# Patient Record
Sex: Female | Born: 1990
Health system: Southern US, Community
[De-identification: ages and names within clinical notes are randomized; demographics above are authoritative.]

## PROBLEM LIST (undated history)

## (undated) DIAGNOSIS — R112 Nausea with vomiting, unspecified: Secondary | ICD-10-CM

## (undated) DIAGNOSIS — F410 Panic disorder [episodic paroxysmal anxiety] without agoraphobia: Secondary | ICD-10-CM

## (undated) DIAGNOSIS — F419 Anxiety disorder, unspecified: Secondary | ICD-10-CM

## (undated) DIAGNOSIS — F99 Mental disorder, not otherwise specified: Secondary | ICD-10-CM

## (undated) DIAGNOSIS — N289 Disorder of kidney and ureter, unspecified: Secondary | ICD-10-CM

## (undated) DIAGNOSIS — Z9889 Other specified postprocedural states: Secondary | ICD-10-CM

## (undated) HISTORY — PX: CHOLECYSTECTOMY: SHX55

## (undated) HISTORY — PX: MYRINGOTOMY: SUR874

## (undated) HISTORY — PX: SHOULDER SURGERY: SHX246

## (undated) HISTORY — DX: Mental disorder, not otherwise specified: F99

---

## 2009-03-12 ENCOUNTER — Ambulatory Visit: Payer: Self-pay | Admitting: Family Medicine

## 2009-08-07 ENCOUNTER — Emergency Department: Payer: Self-pay | Admitting: Emergency Medicine

## 2009-09-06 ENCOUNTER — Ambulatory Visit: Payer: Self-pay | Admitting: General Surgery

## 2009-09-13 ENCOUNTER — Ambulatory Visit: Payer: Self-pay | Admitting: General Surgery

## 2010-01-23 DIAGNOSIS — Z62810 Personal history of physical and sexual abuse in childhood: Secondary | ICD-10-CM | POA: Insufficient documentation

## 2010-08-24 ENCOUNTER — Emergency Department: Payer: Self-pay | Admitting: Emergency Medicine

## 2010-12-10 ENCOUNTER — Emergency Department: Payer: Self-pay | Admitting: Emergency Medicine

## 2010-12-11 ENCOUNTER — Emergency Department: Payer: Self-pay | Admitting: Emergency Medicine

## 2011-03-03 ENCOUNTER — Emergency Department: Payer: Self-pay | Admitting: Emergency Medicine

## 2012-03-07 ENCOUNTER — Emergency Department: Payer: Self-pay | Admitting: Emergency Medicine

## 2012-03-08 ENCOUNTER — Encounter (HOSPITAL_COMMUNITY): Payer: Self-pay | Admitting: *Deleted

## 2012-03-08 ENCOUNTER — Emergency Department (HOSPITAL_COMMUNITY)
Admission: EM | Admit: 2012-03-08 | Discharge: 2012-03-08 | Disposition: A | Discharge status: Home / Self Care | Payer: Self-pay | Attending: Emergency Medicine | Admitting: Emergency Medicine

## 2012-03-08 DIAGNOSIS — L03119 Cellulitis of unspecified part of limb: Secondary | ICD-10-CM | POA: Insufficient documentation

## 2012-03-08 DIAGNOSIS — L02419 Cutaneous abscess of limb, unspecified: Secondary | ICD-10-CM | POA: Insufficient documentation

## 2012-03-08 DIAGNOSIS — L0390 Cellulitis, unspecified: Secondary | ICD-10-CM

## 2012-03-08 LAB — CBC
HCT: 38.8 % (ref 36.0–46.0)
Hemoglobin: 12.9 g/dL (ref 12.0–15.0)
MCH: 29.1 pg (ref 26.0–34.0)
MCHC: 33.2 g/dL (ref 30.0–36.0)
MCV: 87.6 fL (ref 78.0–100.0)
Platelets: 222 10*3/uL (ref 150–400)
RBC: 4.43 MIL/uL (ref 3.87–5.11)
RDW: 12.7 % (ref 11.5–15.5)
WBC: 8.6 10*3/uL (ref 4.0–10.5)

## 2012-03-08 MED ORDER — DIPHENHYDRAMINE HCL 50 MG/ML IJ SOLN
25.0000 mg | Freq: Once | INTRAMUSCULAR | Status: AC
  Administered 2012-03-08: 25 mg via INTRAVENOUS
  Filled 2012-03-08: qty 1

## 2012-03-08 MED ORDER — GI COCKTAIL ~~LOC~~
30.0000 mL | Freq: Once | ORAL | Status: DC
  Filled 2012-03-08: qty 30

## 2012-03-08 MED ORDER — ONDANSETRON HCL 4 MG/2ML IJ SOLN
4.0000 mg | Freq: Once | INTRAMUSCULAR | Status: AC
  Administered 2012-03-08: 4 mg via INTRAVENOUS
  Filled 2012-03-08: qty 2

## 2012-03-08 MED ORDER — MORPHINE SULFATE 4 MG/ML IJ SOLN
4.0000 mg | Freq: Once | INTRAMUSCULAR | Status: AC
  Administered 2012-03-08: 4 mg via INTRAVENOUS
  Filled 2012-03-08: qty 1

## 2012-03-08 MED ORDER — VANCOMYCIN HCL IN DEXTROSE 1-5 GM/200ML-% IV SOLN
1000.0000 mg | Freq: Once | INTRAVENOUS | Status: AC
  Administered 2012-03-08: 1000 mg via INTRAVENOUS
  Filled 2012-03-08: qty 200

## 2012-03-08 NOTE — ED Notes (Signed)
Went to [facility] and was bactrim. Now is getting bigger

## 2012-03-08 NOTE — ED Notes (Signed)
Discharge instructions reviewed with pt, questions answered. Pt verbalized understanding.  

## 2012-03-08 NOTE — ED Notes (Signed)
Family at bedside. Patient states she is feeling a lot better.

## 2012-03-08 NOTE — ED Notes (Signed)
Pt stated feeling much better since benadryl given

## 2012-03-08 NOTE — ED Provider Notes (Signed)
History     CSN: [id number]  Arrival date & time 03/08/12  6578   First MD Initiated Contact with Patient 03/08/12 0539      Chief Complaint  Patient presents with  . Recurrent Skin Infections    (Consider location/radiation/quality/duration/timing/severity/associated sxs/prior treatment) The history is provided by the patient.  location left thigh. Pain sharp in quality and not radiating. There is associated rash and swelling. About 24 hours ago patient was going to bed and felt something bite her on her left thigh. She did not see an insect or spider and denies seeing a stinger. She developed rash and swelling and went to the [facility] her she was prescribed tramadol and Bactrim. She has Motrin 800 mg at home that she's also been taking. She presents here for redness swelling extending beyond inked outline done at [facility]. She denies any fevers or chills. No nausea or vomiting. No weakness or numbness. No pointing or drainage from wound. Patient very concerned that she did have blood work done and she was not given IV antibiotics. No history of MRSA or abscess.  History reviewed. No pertinent past medical history.  Past Surgical History  Procedure Date  . Cholecystectomy     History reviewed. No pertinent family history.  History  Substance Use Topics  . Smoking status: Not on file  . Smokeless tobacco: Not on file  . Alcohol Use: Yes    OB History    Grav Para Term Preterm Abortions TAB SAB Ect Mult Living                  Review of Systems  Constitutional: Negative for fever and chills.  HENT: Negative for neck pain and neck stiffness.   Eyes: Negative for pain.  Respiratory: Negative for shortness of breath.   Cardiovascular: Negative for chest pain.  Gastrointestinal: Negative for abdominal pain.  Genitourinary: Negative for dysuria.  Musculoskeletal: Negative for back pain.  Skin: Positive for rash.  Neurological: Negative for headaches.  All other  systems reviewed and are negative.    Allergies  Zithromax  Home Medications   Current Outpatient Rx  Name Route Sig Dispense Refill  . NORETHIN-ETH ESTRAD TRIPHASIC 0.5/0.75/1-35 MG-MCG PO TABS Oral Take 1 tablet by mouth daily.    . SULFAMETHOXAZOLE-TMP DS 800-160 MG PO TABS Oral Take 1 tablet by mouth 2 (two) times daily.      BP 114/68  Pulse 83  Temp(Src) 98.2 F (36.8 C) (Oral)  Resp 12  Ht 5\' 3"  (1.6 m)  Wt 152 lb (68.947 kg)  BMI 26.93 kg/m2  SpO2 100%  Physical Exam  Constitutional: She is oriented to person, place, and time. She appears well-developed and well-nourished.  HENT:  Head: Normocephalic and atraumatic.  Eyes: Conjunctivae and EOM are normal. Pupils are equal, round, and reactive to light.  Neck: Trachea normal. Neck supple. No thyromegaly present.  Cardiovascular: Normal rate, regular rhythm, S1 normal, S2 normal and normal pulses.     No systolic murmur is present   No diastolic murmur is present  Pulses:      Radial pulses are 2+ on the right side, and 2+ on the left side.  Pulmonary/Chest: Effort normal and breath sounds normal. She has no wheezes. She has no rhonchi. She has no rales. She exhibits no tenderness.  Abdominal: Soft. Normal appearance and bowel sounds are normal. There is no tenderness. There is no CVA tenderness and negative Murphy's sign.  Musculoskeletal:  Left lower extremity: Area of tenderness and mild swelling with erythema and warmth to enter proximal left thigh. There is erythema just beyond the borders of an ink outline. There is no streaking lymphangitis. Distal neurovascular is intact. No cords and negative Homans sign. No calf tenderness or swelling. No evidence of retained insect parts or stinger. No pointing or fluctuance.  Neurological: She is alert and oriented to person, place, and time. She has normal strength. No cranial nerve deficit or sensory deficit. GCS eye subscore is 4. GCS verbal subscore is 5. GCS motor  subscore is 6.  Skin: Skin is warm and dry. No rash noted. She is not diaphoretic.  Psychiatric: Her speech is normal.       Cooperative and appropriate    ED Course  Procedures (including critical care time)   Labs Reviewed  CBC    IV antibiotics and ice. Pain medications provided.  MDM   Presentation consistent with localized reaction to insect envenomation. No retained insect parts on exam. No clinical abscess.  Patient is prescribed appropriate antibiotics in addition to pain medications. She has no fevers or leukocytosis.  Plan recheck 48 hours continue outpatient management. Reliable historian and verbalizes understanding all discharge and followup instructions.       Sunnie Nielsen, MD 03/08/12 [phone]

## 2012-03-08 NOTE — ED Notes (Signed)
Pt co itching all over, MD aware

## 2012-03-08 NOTE — Discharge Instructions (Signed)
Cellulitis Cellulitis is an infection of the tissue under the skin. The infected area is usually red and tender. This is caused by germs. These germs enter the body through cuts or sores. This usually happens in the arms or lower legs.  Continue antibiotics as prescribed. Apply ice as discussed. Take pain medications as prescribed and Motrin as needed for pain and swelling. Return here in 2 days for recheck. Be evaluated sooner for any worsening condition HOME CARE   Take your medicine as told. Finish it even if you start to feel better.   If the infection is on the arm or leg, keep it raised (elevated).   Use a warm cloth on the infected area several times a day.   See your doctor for a follow-up visit as told.  GET HELP RIGHT AWAY IF:   You are tired or confused.   You throw up (vomit).   You have watery poop (diarrhea).   You feel ill and have muscle aches.   You have a fever.  MAKE SURE YOU:   Understand these instructions.   Will watch your condition.   Will get help right away if you are not doing well or get worse.

## 2012-03-08 NOTE — ED Notes (Signed)
Pt co "heartburn", MD to be notified.

## 2012-09-01 ENCOUNTER — Encounter (HOSPITAL_COMMUNITY): Payer: Self-pay | Admitting: *Deleted

## 2012-09-01 ENCOUNTER — Emergency Department (HOSPITAL_COMMUNITY)
Admission: EM | Admit: 2012-09-01 | Discharge: 2012-09-02 | Disposition: A | Discharge status: Home / Self Care | Payer: BC Managed Care – PPO | Attending: Emergency Medicine | Admitting: Emergency Medicine

## 2012-09-01 ENCOUNTER — Emergency Department (HOSPITAL_COMMUNITY): Payer: BC Managed Care – PPO

## 2012-09-01 DIAGNOSIS — R110 Nausea: Secondary | ICD-10-CM | POA: Insufficient documentation

## 2012-09-01 DIAGNOSIS — Z9089 Acquired absence of other organs: Secondary | ICD-10-CM | POA: Insufficient documentation

## 2012-09-01 DIAGNOSIS — R109 Unspecified abdominal pain: Secondary | ICD-10-CM | POA: Insufficient documentation

## 2012-09-01 LAB — BASIC METABOLIC PANEL
BUN: 10 mg/dL (ref 6–23)
CO2: 25 mEq/L (ref 19–32)
Calcium: 9.7 mg/dL (ref 8.4–10.5)
Chloride: 102 mEq/L (ref 96–112)
Creatinine, Ser: 0.69 mg/dL (ref 0.50–1.10)
GFR calc Af Amer: >90 mL/min (ref >90)
GFR calc non Af Amer: >90 mL/min (ref >90)
Glucose, Bld: 95 mg/dL (ref 70–99)
Potassium: 3.5 mEq/L (ref 3.5–5.1)
Sodium: 137 mEq/L (ref 135–145)

## 2012-09-01 LAB — URINALYSIS, ROUTINE W REFLEX MICROSCOPIC
Bilirubin Urine: NEGATIVE
Glucose, UA: NEGATIVE mg/dL
Hgb urine dipstick: NEGATIVE
Ketones, ur: NEGATIVE mg/dL
Leukocytes, UA: NEGATIVE
Nitrite: NEGATIVE
Protein, ur: NEGATIVE mg/dL
Specific Gravity, Urine: 1.02 (ref 1.005–1.030)
Urobilinogen, UA: 0.2 mg/dL (ref 0.0–1.0)
pH: 6.5 (ref 5.0–8.0)

## 2012-09-01 LAB — CBC WITH DIFFERENTIAL/PLATELET
Basophils Absolute: 0 10*3/uL (ref 0.0–0.1)
Basophils Relative: 0 % (ref 0–1)
Eosinophils Absolute: 0.1 10*3/uL (ref 0.0–0.7)
Eosinophils Relative: 1 % (ref 0–5)
HCT: 40 % (ref 36.0–46.0)
Hemoglobin: 13.8 g/dL (ref 12.0–15.0)
Lymphocytes Relative: 27 % (ref 12–46)
Lymphs Abs: 2.7 10*3/uL (ref 0.7–4.0)
MCH: 30.3 pg (ref 26.0–34.0)
MCHC: 34.5 g/dL (ref 30.0–36.0)
MCV: 87.9 fL (ref 78.0–100.0)
Monocytes Absolute: 0.6 10*3/uL (ref 0.1–1.0)
Monocytes Relative: 6 % (ref 3–12)
Neutro Abs: 6.6 10*3/uL (ref 1.7–7.7)
Neutrophils Relative %: 66 % (ref 43–77)
Platelets: 237 10*3/uL (ref 150–400)
RBC: 4.55 MIL/uL (ref 3.87–5.11)
RDW: 12.3 % (ref 11.5–15.5)
WBC: 10 10*3/uL (ref 4.0–10.5)

## 2012-09-01 LAB — HCG, QUANTITATIVE, PREGNANCY: hCG, Beta Chain, Quant, S: 1 m[IU]/mL (ref <5)

## 2012-09-01 LAB — POCT PREGNANCY, URINE: Preg Test, Ur: NEGATIVE

## 2012-09-01 MED ORDER — SODIUM CHLORIDE 0.9 % IV SOLN: INTRAVENOUS | Status: DC

## 2012-09-01 MED ORDER — ONDANSETRON HCL 4 MG/2ML IJ SOLN
4.0000 mg | Freq: Once | INTRAMUSCULAR | Status: AC
  Administered 2012-09-01: 4 mg via INTRAVENOUS
  Filled 2012-09-01: qty 2

## 2012-09-01 MED ORDER — SODIUM CHLORIDE 0.9 % IV BOLUS (SEPSIS)
250.0000 mL | Freq: Once | INTRAVENOUS | Status: AC
  Administered 2012-09-01: 23:00:00 via INTRAVENOUS

## 2012-09-01 NOTE — ED Provider Notes (Addendum)
History  This chart was scribed for Shelda Jakes, MD by Bennett Scrape. This patient was seen in room APA09/APA09 and the patient's care was started at 7:56PM.  CSN: [id number]  Arrival date & time 09/01/12  1632   First MD Initiated Contact with Patient 09/01/12 1956      Chief Complaint  Patient presents with  . Abdominal Pain     Patient is a 21 y.o. female presenting with abdominal pain. The history is provided by the patient. No language interpreter was used.  Abdominal Pain The primary symptoms of the illness include abdominal pain and nausea. The primary symptoms of the illness do not include fever, shortness of breath, vomiting or diarrhea. The current episode started more than 2 days ago. The problem has not changed since onset. Symptoms associated with the illness do not include chills or back pain. Significant associated medical issues do not include inflammatory bowel disease or diabetes.    Donna Wallace is a 21 y.o. female who presents to the Emergency Department complaining of one week of intermittent middle right abdominal pain described as stabbing that is worse with urination with associated nausea. She denies having pain currently. She reports that the episodes last between 30 minutes to one hour.  She reports a possible pregnancy with associated symptoms of vaginal discharge, breast swelling and tenderness and nasuea but states that she has taken several negative pregnancy tests at home. She reports that her LNMP was 07/20/2012 and she denies any vaginal bleeding since. She reports having a h/o prior miscarriages and denies any full term pregnancies. She denies fever, neck pain, sore throat, visual disturbance, CP, cough, SOB, emesis, diarrhea, urinary symptoms, back pain, HA, weakness, numbness and rash as associated symptoms. She does not have a h/o chronic medical conditions. She is an occasional alcohol user.  No current PCP.  History reviewed. No pertinent  past medical history.  Past Surgical History  Procedure Date  . Cholecystectomy     No family history on file.  History  Substance Use Topics  . Smoking status: Not on file  . Smokeless tobacco: Not on file  . Alcohol Use: Yes    No OB history provided.  Review of Systems  Constitutional: Negative for fever and chills.  HENT: Negative for congestion and sore throat.   Eyes: Negative for visual disturbance.  Respiratory: Negative for cough and shortness of breath.   Cardiovascular: Negative for chest pain.  Gastrointestinal: Positive for nausea and abdominal pain. Negative for vomiting and diarrhea.  Musculoskeletal: Negative for back pain.  Skin: Negative for rash.    Allergies  Dilaudid and Zithromax  Home Medications   Current Outpatient Rx  Name Route Sig Dispense Refill  . PRENATAL MULTIVITAMIN CH Oral Take 1 tablet by mouth daily.    Marland Kitchen HYDROCODONE-ACETAMINOPHEN 5-325 MG PO TABS Oral Take 1-2 tablets by mouth every 6 (six) hours as needed for pain. 14 tablet 0  . PROMETHAZINE HCL 25 MG PO TABS Oral Take 1 tablet (25 mg total) by mouth every 6 (six) hours as needed for nausea. 12 tablet 0    Triage Vitals: BP 125/77  Pulse 77  Temp 98.5 F (36.9 C) (Oral)  Resp 20  Ht 5\' 3"  (1.6 m)  Wt 154 lb (69.854 kg)  BMI 27.28 kg/m2  SpO2 100%  LMP 07/20/2012  Physical Exam  Nursing note and vitals reviewed. Constitutional: She is oriented to person, place, and time. She appears well-developed and well-nourished. No distress.  HENT:  Head: Normocephalic and atraumatic.  Eyes: Conjunctivae normal and EOM are normal.  Neck: Neck supple. No tracheal deviation present.  Cardiovascular: Normal rate and regular rhythm.   No murmur heard. Pulmonary/Chest: Effort normal and breath sounds normal. No respiratory distress.  Abdominal: Soft. Bowel sounds are normal. There is no tenderness.  Musculoskeletal: Normal range of motion. She exhibits no edema (no ankle swelling).         Able to move both sets of toes and fingers  Neurological: She is alert and oriented to person, place, and time. No cranial nerve deficit.  Skin: Skin is warm and dry.  Psychiatric: She has a normal mood and affect. Her behavior is normal.    ED Course  Procedures (including critical care time)  DIAGNOSTIC STUDIES: Oxygen Saturation is 100% on room air, normal by my interpretation.    COORDINATION OF CARE: 8:25PM-Informed pt of negative pregnancy test. Pt reports that she wants a blood test to confirm, because her menstrual cycle 3 weeks late.  8:28PM-Discussed treatment plan which includes CT of abdomen if pregnancy test is negative or [place] if pregnancy test is positive with pt at bedside and pt agreed to plan.   Labs Reviewed  URINALYSIS, ROUTINE W REFLEX MICROSCOPIC  POCT PREGNANCY, URINE  CBC WITH DIFFERENTIAL  BASIC METABOLIC PANEL  HCG, QUANTITATIVE, PREGNANCY   Ct Abdomen Pelvis W Contrast  09/02/2012  *RADIOLOGY REPORT*  Clinical Data: Abdominal pain  CT ABDOMEN AND PELVIS WITH CONTRAST  Technique:  Multidetector CT imaging of the abdomen and pelvis was performed following the standard protocol during bolus administration of intravenous contrast.  Contrast: OMNIPAQUE IOHEXOL 300 MG/ML  SOLN  Comparison: None.  Findings: Limited images through the lung bases demonstrate no significant appreciable abnormality. The heart size is within normal limits. No pleural or pericardial effusion.  Unremarkable liver, spleen, pancreas, adrenal glands, left kidney. Absent gallbladder.  No biliary duct dilatation.  There is mild pelvicaliectasis on the right with smooth tapering to a normal caliber ureter.  No ureteral calculi identified.  No bowel obstruction.  No CT evidence for colitis.  Appendix within normal limits.  No free intraperitoneal air.  No lymphadenopathy.  Normal caliber vasculature.  Circumaortic left renal vein.  Thin-walled bladder.  Unremarkable CT appearance to the  uterus and ovaries.  Trace free fluid within the right adnexa, often physiologic.  No acute osseous finding.  IMPRESSION: Minimal right pelvicaliectasis without hydroureter.  Favored to be incidental.  Otherwise, no acute abnormality identified.  Normal appendix.   Original Report Authenticated By: Waneta Martins, M.D.     Results for orders placed during the hospital encounter of 09/01/12  URINALYSIS, ROUTINE W REFLEX MICROSCOPIC      Component Value Range   Color, Urine YELLOW  YELLOW   APPearance CLEAR  CLEAR   Specific Gravity, Urine 1.020  1.005 - 1.030   pH 6.5  5.0 - 8.0   Glucose, UA NEGATIVE  NEGATIVE mg/dL   Hgb urine dipstick NEGATIVE  NEGATIVE   Bilirubin Urine NEGATIVE  NEGATIVE   Ketones, ur NEGATIVE  NEGATIVE mg/dL   Protein, ur NEGATIVE  NEGATIVE mg/dL   Urobilinogen, UA 0.2  0.0 - 1.0 mg/dL   Nitrite NEGATIVE  NEGATIVE   Leukocytes, UA NEGATIVE  NEGATIVE  POCT PREGNANCY, URINE      Component Value Range   Preg Test, Ur NEGATIVE  NEGATIVE  CBC WITH DIFFERENTIAL      Component Value Range   WBC 10.0  4.0 - 10.5 K/uL   RBC 4.55  3.87 - 5.11 MIL/uL   Hemoglobin 13.8  12.0 - 15.0 g/dL   HCT 13.0  86.5 - 78.4 %   MCV 87.9  78.0 - 100.0 fL   MCH 30.3  26.0 - 34.0 pg   MCHC 34.5  30.0 - 36.0 g/dL   RDW 69.6  29.5 - 28.4 %   Platelets 237  150 - 400 K/uL   Neutrophils Relative 66  43 - 77 %   Neutro Abs 6.6  1.7 - 7.7 K/uL   Lymphocytes Relative 27  12 - 46 %   Lymphs Abs 2.7  0.7 - 4.0 K/uL   Monocytes Relative 6  3 - 12 %   Monocytes Absolute 0.6  0.1 - 1.0 K/uL   Eosinophils Relative 1  0 - 5 %   Eosinophils Absolute 0.1  0.0 - 0.7 K/uL   Basophils Relative 0  0 - 1 %   Basophils Absolute 0.0  0.0 - 0.1 K/uL  BASIC METABOLIC PANEL      Component Value Range   Sodium 137  135 - 145 mEq/L   Potassium 3.5  3.5 - 5.1 mEq/L   Chloride 102  96 - 112 mEq/L   CO2 25  19 - 32 mEq/L   Glucose, Bld 95  70 - 99 mg/dL   BUN 10  6 - 23 mg/dL   Creatinine, Ser 1.32   0.50 - 1.10 mg/dL   Calcium 9.7  8.4 - 44.0 mg/dL   GFR calc non Af Amer >90  >90 mL/min   GFR calc Af Amer >90  >90 mL/min  HCG, QUANTITATIVE, PREGNANCY      Component Value Range   hCG, Beta Chain, Quant, S 1  <5 mIU/mL     1. Abdominal pain       MDM  CT scan of abdomen is pending. Patient was convinced that she was pregnant based on her symptoms however urine pretty test was negative and the hCG beta Quant also was less than 5 which is consistent with a negative pregnancy test.  Disposition will be based on CT scan. Patient also given referral to Dr. Emelda Fear patient's had several miscarriages in the past no evidence of miscarriage her tonight but will have her consult with him.     I personally performed the services described in this documentation, which was scribed in my presence. The recorded information has been reviewed and considered.     Shelda Jakes, MD 09/02/12 0028   Addendum: CT scan without specific findings. We'll discharge patient home with referral to Dr. Emelda Fear pain medicine and antinausea medicine.  Shelda Jakes, MD 09/02/12 [phone]

## 2012-09-01 NOTE — ED Notes (Signed)
Pt states lower abdominal pain which is worse with urination.

## 2012-09-02 MED ORDER — PROMETHAZINE HCL 25 MG PO TABS: 25.0000 mg | ORAL_TABLET | Freq: Four times a day (QID) | ORAL | Status: DC | PRN

## 2012-09-02 MED ORDER — IOHEXOL 300 MG/ML  SOLN
100.0000 mL | Freq: Once | INTRAMUSCULAR | Status: AC | PRN
  Administered 2012-09-02: 100 mL via INTRAVENOUS

## 2012-09-02 MED ORDER — HYDROCODONE-ACETAMINOPHEN 5-325 MG PO TABS: 1.0000 | ORAL_TABLET | Freq: Four times a day (QID) | ORAL | Status: AC | PRN

## 2012-11-20 ENCOUNTER — Emergency Department (HOSPITAL_COMMUNITY)
Admission: EM | Admit: 2012-11-20 | Discharge: 2012-11-20 | Disposition: A | Discharge status: Home / Self Care | Payer: BC Managed Care – PPO | Attending: Emergency Medicine | Admitting: Emergency Medicine

## 2012-11-20 ENCOUNTER — Encounter (HOSPITAL_COMMUNITY): Payer: Self-pay | Admitting: Emergency Medicine

## 2012-11-20 ENCOUNTER — Emergency Department (HOSPITAL_COMMUNITY): Payer: BC Managed Care – PPO

## 2012-11-20 DIAGNOSIS — F419 Anxiety disorder, unspecified: Secondary | ICD-10-CM

## 2012-11-20 DIAGNOSIS — F411 Generalized anxiety disorder: Secondary | ICD-10-CM | POA: Insufficient documentation

## 2012-11-20 DIAGNOSIS — Z87891 Personal history of nicotine dependence: Secondary | ICD-10-CM | POA: Insufficient documentation

## 2012-11-20 DIAGNOSIS — R110 Nausea: Secondary | ICD-10-CM | POA: Insufficient documentation

## 2012-11-20 DIAGNOSIS — Z8659 Personal history of other mental and behavioral disorders: Secondary | ICD-10-CM | POA: Insufficient documentation

## 2012-11-20 DIAGNOSIS — R0602 Shortness of breath: Secondary | ICD-10-CM | POA: Insufficient documentation

## 2012-11-20 DIAGNOSIS — Z87448 Personal history of other diseases of urinary system: Secondary | ICD-10-CM | POA: Insufficient documentation

## 2012-11-20 DIAGNOSIS — R0789 Other chest pain: Secondary | ICD-10-CM | POA: Insufficient documentation

## 2012-11-20 HISTORY — DX: Panic disorder (episodic paroxysmal anxiety): F41.0

## 2012-11-20 HISTORY — DX: Disorder of kidney and ureter, unspecified: N28.9

## 2012-11-20 MED ORDER — LORAZEPAM 1 MG PO TABS
1.0000 mg | ORAL_TABLET | Freq: Once | ORAL | Status: DC
  Filled 2012-11-20: qty 1

## 2012-11-20 MED ORDER — PROMETHAZINE HCL 12.5 MG PO TABS
12.5000 mg | ORAL_TABLET | Freq: Once | ORAL | Status: DC
  Filled 2012-11-20: qty 1

## 2012-11-20 MED ORDER — LORAZEPAM 0.5 MG PO TABS: 0.5000 mg | ORAL_TABLET | Freq: Four times a day (QID) | ORAL | Status: DC | PRN

## 2012-11-20 MED ORDER — PROMETHAZINE HCL 12.5 MG PO TABS: 12.5000 mg | ORAL_TABLET | Freq: Four times a day (QID) | ORAL | Status: DC | PRN

## 2012-11-20 NOTE — ED Notes (Signed)
Hx of panic attacks. States she and husband had argument this am and started having stabbing pain under Left breast that has been constant with nausea. Pt slightly tearful. Denies sob/dizziness/v/d. States this is how she normally feels with her anxiety attacks but the cp is more constant than usual.

## 2012-11-20 NOTE — ED Notes (Signed)
phenergen and ativan order cancelled vo Ivery Quale PA due to pt driving and has no ride.

## 2012-11-20 NOTE — ED Provider Notes (Signed)
History     CSN: [id number]  Arrival date & time 11/20/12  4098   First MD Initiated Contact with Patient 11/20/12 [phone]      Chief Complaint  Patient presents with  . Chest Pain    (Consider location/radiation/quality/duration/timing/severity/associated sxs/prior treatment) Patient is a 21 y.o. female presenting with chest pain. The history is provided by the patient.  Chest Pain The chest pain began 1 - 2 hours ago. Chest pain occurs intermittently. The chest pain is worsening. The pain is associated with stress. At its most intense, the pain is at 9/10. The pain is currently at 4/10. The quality of the pain is described as stabbing. The pain does not radiate. Chest pain is worsened by stress. Primary symptoms include shortness of breath and nausea. Pertinent negatives for primary symptoms include no fatigue, no syncope, no cough, no wheezing, no palpitations, no abdominal pain, no vomiting and no dizziness.  Pertinent negatives for associated symptoms include no claudication, no diaphoresis, no near-syncope, no numbness and no weakness. She tried nothing for the symptoms. Risk factors include alcohol intake.  Her past medical history is significant for anxiety/panic attacks.  Pertinent negatives for past medical history include no arrhythmia, no diabetes, no DVT, no hypertension, no PE, no seizures and no strokes.  Pertinent negatives for family medical history include: no hypertension in family.  Procedure history is negative for cardiac catheterization, echocardiogram, stress echo and exercise treadmill test.     Past Medical History  Diagnosis Date  . Panic attacks   . Renal disorder     Past Surgical History  Procedure Date  . Cholecystectomy   . Myringotomy     History reviewed. No pertinent family history.  History  Substance Use Topics  . Smoking status: Former Games developer  . Smokeless tobacco: Not on file  . Alcohol Use: Yes     Comment: weekends    OB History     Grav Para Term Preterm Abortions TAB SAB Ect Mult Living                  Review of Systems  Constitutional: Negative for diaphoresis, activity change and fatigue.       All ROS Neg except as noted in HPI  HENT: Negative for nosebleeds and neck pain.   Eyes: Negative for photophobia and discharge.  Respiratory: Positive for shortness of breath. Negative for cough and wheezing.   Cardiovascular: Positive for chest pain. Negative for palpitations, claudication, syncope and near-syncope.  Gastrointestinal: Positive for nausea. Negative for vomiting, abdominal pain and blood in stool.  Genitourinary: Negative for dysuria, frequency and hematuria.  Musculoskeletal: Negative for back pain and arthralgias.  Skin: Negative.   Neurological: Negative for dizziness, seizures, speech difficulty, weakness and numbness.  Psychiatric/Behavioral: Negative for hallucinations and confusion.    Allergies  Dilaudid and Zithromax  Home Medications   Current Outpatient Rx  Name  Route  Sig  Dispense  Refill  . PRENATAL MULTIVITAMIN CH   Oral   Take 1 tablet by mouth daily.         Marland Kitchen PROMETHAZINE HCL 25 MG PO TABS   Oral   Take 1 tablet (25 mg total) by mouth every 6 (six) hours as needed for nausea.   12 tablet   0     BP 120/78  Pulse 81  Temp 98.1 F (36.7 C) (Oral)  Resp 17  SpO2 99%  LMP 11/20/2012  Physical Exam  Nursing note and vitals reviewed. Constitutional: She  is oriented to person, place, and time. She appears well-developed and well-nourished.  Non-toxic appearance.  HENT:  Head: Normocephalic.  Right Ear: Tympanic membrane and external ear normal.  Left Ear: Tympanic membrane and external ear normal.  Eyes: EOM and lids are normal. Pupils are equal, round, and reactive to light.  Neck: Normal range of motion. Neck supple. Carotid bruit is not present.  Cardiovascular: Normal rate, regular rhythm, normal heart sounds, intact distal pulses and normal pulses.    Pulmonary/Chest: Breath sounds normal. No respiratory distress.       Mild left chest wall tenderness. No palpable deformity. Symmetrical rise and fall of the chest.  Abdominal: Soft. Bowel sounds are normal. There is no tenderness. There is no guarding.  Musculoskeletal: Normal range of motion.  Lymphadenopathy:       Head (right side): No submandibular adenopathy present.       Head (left side): No submandibular adenopathy present.    She has no cervical adenopathy.  Neurological: She is alert and oriented to person, place, and time. She has normal strength. No cranial nerve deficit or sensory deficit.  Skin: Skin is warm and dry.  Psychiatric: Her speech is normal. Her mood appears anxious.    ED Course  Procedures (including critical care time)  Labs Reviewed - No data to display No results found. EKG 8:35. Rate 79. Rhythm-  normal sinus rhythm. PR-normal. Axis-normal. QRS-normal. ST-normal.No STEMI. No life threatening arrhythmias. No old EKG for comparison.  No diagnosis found.    MDM  I have reviewed nursing notes, vital signs, and all appropriate lab and imaging results for this patient. Patient states that she and her husband were in an argument this early a.m. and she began to have a stabbing pain in the left chest and breast area as the argument escalated. It is of note that the patient has a history of panic type attacks. Patient reports that the symptoms that she had this morning are similar to previous anxiety related attacks. There no abnormal murmurs or rubs or symptoms may cause concern for pericarditis. The electrocardiogram shows a normal sinus rhythm in the pattern of the pain does not seem to be consistent with a acute coronary syndrome. Chest x-ray was normal and shows no evidence of a pneumothorax. There is no recent surgery, no recent trauma, no recent extended trips, patient is on birth control pills and a smoker, but no physical exam findings that raise suspicion  for PE.  Vital signs are stable. Pulse oximetry is 99% on room air. Patient speaks in complete sentences. Feel that it is safe for the patient to be discharged home. Patient is given a prescription for short course of nausea medication, promethazine 12.5 mg one every 6 hours. Patient also given Ativan 0.5 mg one every 6 hours as needed for anxiety #15. Patient strongly encouraged to see the behavioral health specialist in the [place] area.       Kathie Dike, [place] 11/20/12 1004

## 2012-11-20 NOTE — ED Provider Notes (Signed)
Medical screening examination/treatment/procedure(s) were performed by non-physician practitioner and as supervising physician I was immediately available for consultation/collaboration.  Raeford Razor, MD 11/20/12 [phone]

## 2013-02-18 ENCOUNTER — Emergency Department: Payer: Self-pay | Admitting: Emergency Medicine

## 2013-02-28 ENCOUNTER — Emergency Department: Payer: Self-pay | Admitting: Emergency Medicine

## 2013-02-28 LAB — URINALYSIS, COMPLETE
Bilirubin,UR: NEGATIVE
Blood: NEGATIVE
Glucose,UR: NEGATIVE mg/dL (ref 0–75)
Ketone: NEGATIVE
Leukocyte Esterase: NEGATIVE
Nitrite: NEGATIVE
Ph: 7 (ref 4.5–8.0)
Protein: NEGATIVE
RBC,UR: 1 /HPF (ref 0–5)
Specific Gravity: 1.016 (ref 1.003–1.030)
Squamous Epithelial: 1
WBC UR: 3 /HPF (ref 0–5)

## 2013-02-28 LAB — CBC
HCT: 38.1 % (ref 35.0–47.0)
HGB: 12.9 g/dL (ref 12.0–16.0)
MCH: 30 pg (ref 26.0–34.0)
MCHC: 33.8 g/dL (ref 32.0–36.0)
MCV: 89 fL (ref 80–100)
Platelet: 201 10*3/uL (ref 150–440)
RBC: 4.3 10*6/uL (ref 3.80–5.20)
RDW: 12.6 % (ref 11.5–14.5)
WBC: 7.4 10*3/uL (ref 3.6–11.0)

## 2013-02-28 LAB — COMPREHENSIVE METABOLIC PANEL
Albumin: 4 g/dL (ref 3.4–5.0)
Alkaline Phosphatase: 56 U/L (ref 50–136)
Anion Gap: 6 — ABNORMAL LOW (ref 7–16)
BUN: 8 mg/dL (ref 7–18)
Bilirubin,Total: 0.3 mg/dL (ref 0.2–1.0)
Calcium, Total: 8.8 mg/dL (ref 8.5–10.1)
Chloride: 110 mmol/L — ABNORMAL HIGH (ref 98–107)
Co2: 26 mmol/L (ref 21–32)
Creatinine: 0.95 mg/dL (ref 0.60–1.30)
EGFR (African American): >60
EGFR (Non-African Amer.): >60
Glucose: 83 mg/dL (ref 65–99)
Osmolality: 281 (ref 275–301)
Potassium: 3.4 mmol/L — ABNORMAL LOW (ref 3.5–5.1)
SGOT(AST): 17 U/L (ref 15–37)
SGPT (ALT): 12 U/L (ref 12–78)
Sodium: 142 mmol/L (ref 136–145)
Total Protein: 7.7 g/dL (ref 6.4–8.2)

## 2013-03-19 ENCOUNTER — Emergency Department: Payer: Self-pay | Admitting: Unknown Physician Specialty

## 2013-03-19 LAB — URINALYSIS, COMPLETE
Bilirubin,UR: NEGATIVE
Glucose,UR: NEGATIVE mg/dL (ref 0–75)
Ketone: NEGATIVE
Nitrite: NEGATIVE
Ph: 5 (ref 4.5–8.0)
Protein: NEGATIVE
RBC,UR: 15 /HPF (ref 0–5)
Specific Gravity: 1.025 (ref 1.003–1.030)
Squamous Epithelial: 1
WBC UR: 50 /HPF (ref 0–5)

## 2013-03-19 LAB — WET PREP, GENITAL

## 2013-03-25 ENCOUNTER — Emergency Department (HOSPITAL_COMMUNITY)
Admission: EM | Admit: 2013-03-25 | Discharge: 2013-03-26 | Disposition: A | Discharge status: Home / Self Care | Payer: BC Managed Care – PPO | Attending: Emergency Medicine | Admitting: Emergency Medicine

## 2013-03-25 ENCOUNTER — Encounter (HOSPITAL_COMMUNITY): Payer: Self-pay | Admitting: Emergency Medicine

## 2013-03-25 DIAGNOSIS — N898 Other specified noninflammatory disorders of vagina: Secondary | ICD-10-CM | POA: Insufficient documentation

## 2013-03-25 DIAGNOSIS — R109 Unspecified abdominal pain: Secondary | ICD-10-CM | POA: Insufficient documentation

## 2013-03-25 DIAGNOSIS — Z8659 Personal history of other mental and behavioral disorders: Secondary | ICD-10-CM | POA: Insufficient documentation

## 2013-03-25 DIAGNOSIS — Z3202 Encounter for pregnancy test, result negative: Secondary | ICD-10-CM | POA: Insufficient documentation

## 2013-03-25 DIAGNOSIS — N946 Dysmenorrhea, unspecified: Secondary | ICD-10-CM

## 2013-03-25 DIAGNOSIS — N760 Acute vaginitis: Secondary | ICD-10-CM

## 2013-03-25 DIAGNOSIS — Z87448 Personal history of other diseases of urinary system: Secondary | ICD-10-CM | POA: Insufficient documentation

## 2013-03-25 DIAGNOSIS — Z87891 Personal history of nicotine dependence: Secondary | ICD-10-CM | POA: Insufficient documentation

## 2013-03-25 LAB — URINALYSIS, ROUTINE W REFLEX MICROSCOPIC
Bilirubin Urine: NEGATIVE
Glucose, UA: NEGATIVE mg/dL
Hgb urine dipstick: NEGATIVE
Ketones, ur: NEGATIVE mg/dL
Leukocytes, UA: NEGATIVE
Nitrite: NEGATIVE
Protein, ur: NEGATIVE mg/dL
Specific Gravity, Urine: 1.015 (ref 1.005–1.030)
Urobilinogen, UA: 0.2 mg/dL (ref 0.0–1.0)
pH: 7 (ref 5.0–8.0)

## 2013-03-25 LAB — POCT PREGNANCY, URINE: Preg Test, Ur: NEGATIVE

## 2013-03-25 NOTE — ED Notes (Signed)
Patient states "I was having vaginal discharge and abdominal pain 1 week ago and was treated at [facility] for a bacterial infection. I was given an antibiotic. Now I am still having abdominal pain and am having dark bloody vaginal discharge."

## 2013-03-25 NOTE — ED Notes (Signed)
Pelvic cart at bedside. 

## 2013-03-25 NOTE — ED Notes (Signed)
I&O cath done at this time for sample. sample taken to lab. lisa

## 2013-03-26 LAB — WET PREP, GENITAL
Clue Cells Wet Prep HPF POC: NONE SEEN
Trich, Wet Prep: NONE SEEN
Yeast Wet Prep HPF POC: NONE SEEN

## 2013-03-26 MED ORDER — IBUPROFEN 600 MG PO TABS: 600.0000 mg | ORAL_TABLET | Freq: Four times a day (QID) | ORAL | Status: DC | PRN

## 2013-03-27 LAB — GC/CHLAMYDIA PROBE AMP
CT Probe RNA: NEGATIVE
GC Probe RNA: NEGATIVE

## 2013-03-29 NOTE — ED Provider Notes (Signed)
History     CSN: [id number]  Arrival date & time 03/25/13  2157   First MD Initiated Contact with Patient 03/25/13 2248      Chief Complaint  Patient presents with  . Vaginal Bleeding  . Abdominal Pain    (Consider location/radiation/quality/duration/timing/severity/associated sxs/prior treatment) HPI Comments: Donna Wallace is a 22 y.o. Female with persistent vaginal irritation and now dark vaginal bloody discharge since she was seen at [facility] ED one week ago during which her pelvic exam was revealing for bacterial vaginosis.  She is currently on her last day of flagyl.  She describes a scant dark bloody discharge for the past 3-4 days.  She is currently on depo,  Had her first injection 2/14,  And is due for her 3 month booster in 2 weeks. She cannot remember the date of her LMP.  She describes low pelvic cramping along with uncomfortable sex this week.  She denies fever, chills, abdominal pain, nausea, vomiting and dysuria.  She was not notified of any cultures being positive from her Ed visit last week,  So assumes her std screen was negative.     The history is provided by the patient.    Past Medical History  Diagnosis Date  . Panic attacks   . Renal disorder     Past Surgical History  Procedure Laterality Date  . Cholecystectomy    . Myringotomy      History reviewed. No pertinent family history.  History  Substance Use Topics  . Smoking status: Former Games developer  . Smokeless tobacco: Not on file  . Alcohol Use: Yes     Comment: weekends    OB History   Grav Para Term Preterm Abortions TAB SAB Ect Mult Living                  Review of Systems  Constitutional: Negative for fever and chills.  HENT: Negative for congestion, sore throat and neck pain.   Eyes: Negative.   Respiratory: Negative for chest tightness and shortness of breath.   Cardiovascular: Negative for chest pain.  Gastrointestinal: Negative for nausea and abdominal pain.  Genitourinary:  Positive for vaginal bleeding, vaginal discharge, vaginal pain and menstrual problem. Negative for dysuria.  Musculoskeletal: Negative for joint swelling and arthralgias.  Skin: Negative.  Negative for rash and wound.  Neurological: Negative for dizziness, weakness, light-headedness, numbness and headaches.  Psychiatric/Behavioral: Negative.     Allergies  Dilaudid and Zithromax  Home Medications   Current Outpatient Rx  Name  Route  Sig  Dispense  Refill  . medroxyPROGESTERone (DEPO-PROVERA) 150 MG/ML injection   Intramuscular   Inject 150 mg into the muscle every 3 (three) months.         . metroNIDAZOLE (FLAGYL) 500 MG tablet   Oral   Take 500 mg by mouth 2 (two) times daily. 7 day course.         . ibuprofen (ADVIL,MOTRIN) 600 MG tablet   Oral   Take 1 tablet (600 mg total) by mouth every 6 (six) hours as needed for pain.   30 tablet   0     BP 134/81  Pulse 84  Temp(Src) 97.9 F (36.6 C) (Oral)  Resp 14  Ht 5\' 3"  (1.6 m)  Wt 146 lb (66.225 kg)  BMI 25.87 kg/m2  SpO2 99%  Physical Exam  Nursing note and vitals reviewed. Constitutional: She appears well-developed and well-nourished.  HENT:  Head: Normocephalic and atraumatic.  Eyes: Conjunctivae are normal.  Neck: Normal range of motion.  Cardiovascular: Normal rate, regular rhythm, normal heart sounds and intact distal pulses.   Pulmonary/Chest: Effort normal and breath sounds normal. She has no wheezes.  Abdominal: Soft. Bowel sounds are normal. There is no tenderness.  Genitourinary: Uterus normal. Cervix exhibits no motion tenderness. Right adnexum displays no mass and no tenderness. Left adnexum displays no mass and no tenderness. No tenderness around the vagina. Vaginal discharge found.  Scant dark old appearing menstrual blood coating vaginal walls.    Musculoskeletal: Normal range of motion.  Neurological: She is alert.  Skin: Skin is warm and dry.  Psychiatric: She has a normal mood and affect.     ED Course  Procedures (including critical care time)  Labs Reviewed  WET PREP, GENITAL - Abnormal; Notable for the following:    WBC, Wet Prep HPF POC FEW (*)    All other components within normal limits  URINALYSIS, ROUTINE W REFLEX MICROSCOPIC - Abnormal; Notable for the following:    APPearance HAZY (*)    All other components within normal limits  GC/CHLAMYDIA PROBE AMP  POCT PREGNANCY, URINE   No results found.   1. Vaginitis   2. Dysmenorrhea       MDM  Patients labs and/or radiological studies were viewed and considered during the medical decision making and disposition process. Pt was encouraged to finish her flagyl.  She receives her depo at the local health dept.  Encouraged to f/u with them for further management of her depo injections,  Given alternative referral to [facility]. Pt is probably having mild withdrawal bleeding as her hormones acclimate to the depo.  She will be due soon for repeat depo.  Gc/chlamydia results pending.  Attempt to get results from [place] unsuccessful.  Advised to avoid sex while on tx and her partner should also consider tx if sx persist.          Burgess Amor, PA-C 03/29/13 1222

## 2013-03-30 NOTE — ED Provider Notes (Signed)
Medical screening examination/treatment/procedure(s) were performed by non-physician practitioner and as supervising physician I was immediately available for consultation/collaboration.  Terry S. Strand, MD 03/30/13 0138 

## 2013-07-15 ENCOUNTER — Emergency Department: Payer: Self-pay | Admitting: Emergency Medicine

## 2013-08-13 ENCOUNTER — Emergency Department: Payer: Self-pay | Admitting: Emergency Medicine

## 2013-08-22 ENCOUNTER — Encounter (HOSPITAL_COMMUNITY): Payer: Self-pay | Admitting: *Deleted

## 2013-08-22 ENCOUNTER — Emergency Department (HOSPITAL_COMMUNITY)
Admission: EM | Admit: 2013-08-22 | Discharge: 2013-08-22 | Disposition: A | Discharge status: Home / Self Care | Payer: BC Managed Care – PPO | Attending: Emergency Medicine | Admitting: Emergency Medicine

## 2013-08-22 DIAGNOSIS — R109 Unspecified abdominal pain: Secondary | ICD-10-CM

## 2013-08-22 DIAGNOSIS — Z87891 Personal history of nicotine dependence: Secondary | ICD-10-CM | POA: Insufficient documentation

## 2013-08-22 DIAGNOSIS — Z792 Long term (current) use of antibiotics: Secondary | ICD-10-CM | POA: Insufficient documentation

## 2013-08-22 DIAGNOSIS — Z3202 Encounter for pregnancy test, result negative: Secondary | ICD-10-CM | POA: Insufficient documentation

## 2013-08-22 DIAGNOSIS — Z8659 Personal history of other mental and behavioral disorders: Secondary | ICD-10-CM | POA: Insufficient documentation

## 2013-08-22 DIAGNOSIS — IMO0002 Reserved for concepts with insufficient information to code with codable children: Secondary | ICD-10-CM | POA: Insufficient documentation

## 2013-08-22 DIAGNOSIS — Z87448 Personal history of other diseases of urinary system: Secondary | ICD-10-CM | POA: Insufficient documentation

## 2013-08-22 DIAGNOSIS — R1032 Left lower quadrant pain: Secondary | ICD-10-CM | POA: Insufficient documentation

## 2013-08-22 LAB — URINE MICROSCOPIC-ADD ON

## 2013-08-22 LAB — URINALYSIS, ROUTINE W REFLEX MICROSCOPIC
Bilirubin Urine: NEGATIVE
Glucose, UA: NEGATIVE mg/dL
Leukocytes, UA: NEGATIVE
Nitrite: NEGATIVE
Specific Gravity, Urine: >1.03 — ABNORMAL HIGH (ref 1.005–1.030)
Urobilinogen, UA: 0.2 mg/dL (ref 0.0–1.0)
pH: 6 (ref 5.0–8.0)

## 2013-08-22 LAB — POCT PREGNANCY, URINE: Preg Test, Ur: NEGATIVE

## 2013-08-22 MED ORDER — POTASSIUM CHLORIDE CRYS ER 20 MEQ PO TBCR
EXTENDED_RELEASE_TABLET | ORAL | Status: AC
  Filled 2013-08-22: qty 2

## 2013-08-22 NOTE — ED Notes (Signed)
Patient given discharge instruction, verbalized understand. Patient ambulatory out of the department.  

## 2013-08-22 NOTE — ED Notes (Signed)
Pt states she started period yesterday, stopped bleeding today & increase in pain radiates to her back.

## 2013-08-22 NOTE — ED Provider Notes (Signed)
CSN: [id number]     Arrival date & time 08/22/13  0200 History   First MD Initiated Contact with Patient 08/22/13 0243     Chief Complaint  Patient presents with  . Abdominal Pain    Patient is a 22 y.o. female presenting with abdominal pain. The history is provided by the patient.  Abdominal Pain Pain location:  LLQ and RLQ Pain quality: cramping   Pain radiates to:  Back Pain severity:  Moderate Onset quality:  Gradual Duration:  1 day Timing:  Intermittent Progression:  Worsening Chronicity:  New Relieved by:  Nothing Associated symptoms: no dysuria, no fever and no vomiting    Pt reports she has not had a period since June She reports heavy vaginal bleeding yesterday (with clots) that has stopped but now has low abd cramping She has not had any recent pregnancy tests She denies h/o tubal/uterine surgery  Past Medical History  Diagnosis Date  . Panic attacks   . Renal disorder    Past Surgical History  Procedure Laterality Date  . Cholecystectomy    . Myringotomy     No family history on file. History  Substance Use Topics  . Smoking status: Former Games developer  . Smokeless tobacco: Not on file  . Alcohol Use: Yes     Comment: weekends   OB History   Grav Para Term Preterm Abortions TAB SAB Ect Mult Living                 Review of Systems  Constitutional: Negative for fever.  Gastrointestinal: Positive for abdominal pain. Negative for vomiting.  Genitourinary: Negative for dysuria.    Allergies  Dilaudid and Zithromax  Home Medications   Current Outpatient Rx  Name  Route  Sig  Dispense  Refill  . ibuprofen (ADVIL,MOTRIN) 600 MG tablet   Oral   Take 1 tablet (600 mg total) by mouth every 6 (six) hours as needed for pain.   30 tablet   0   . medroxyPROGESTERone (DEPO-PROVERA) 150 MG/ML injection   Intramuscular   Inject 150 mg into the muscle every 3 (three) months.         . metroNIDAZOLE (FLAGYL) 500 MG tablet   Oral   Take 500 mg by mouth 2  (two) times daily. 7 day course.          BP 128/83  Pulse 92  Temp(Src) 98.2 F (36.8 C) (Oral)  Resp 20  Ht 5\' 3"  (1.6 m)  Wt 138 lb (62.596 kg)  BMI 24.45 kg/m2  SpO2 99%  LMP 06/21/2013 Physical Exam CONSTITUTIONAL: Well developed/well nourished HEAD: Normocephalic/atraumatic EYES: EOMI/PERRL ENMT: Mucous membranes moist NECK: supple no meningeal signs SPINE:entire spine nontender CV: S1/S2 noted, no murmurs/rubs/gallops noted LUNGS: Lungs are clear to auscultation bilaterally, no apparent distress ABDOMEN: soft, nontender, no rebound or guarding GU:no cva tenderness No cmt.  No vag bleeding or discharge.  No uterine/adnexal tenderness.  No adnexal masses noted Female nurse chaperone present NEURO: Pt is awake/alert, moves all extremitiesx4 EXTREMITIES: pulses normal, full ROM SKIN: warm, color normal PSYCH: no abnormalities of mood noted  ED Course  Procedures (including critical care time) Labs Review Labs Reviewed  URINALYSIS, ROUTINE W REFLEX MICROSCOPIC  POCT PREGNANCY, URINE   Advised f/u with GYN  MDM  No diagnosis found. Nursing notes including past medical history and social history reviewed and considered in documentation Previous records reviewed and considered Labs/vital reviewed and considered     Joya Gaskins, MD 08/22/13  0340 

## 2013-08-22 NOTE — ED Notes (Signed)
Pt states she has not had a period since July, has been nauseated and thought she was pregnant, pt has heavy bleeding yesterday, passed large clots. Today cramping and pain.  Pt has taken motrin with no relief

## 2014-02-07 ENCOUNTER — Encounter (HOSPITAL_COMMUNITY): Payer: Self-pay | Admitting: Emergency Medicine

## 2014-02-07 ENCOUNTER — Emergency Department (HOSPITAL_COMMUNITY)
Admission: EM | Admit: 2014-02-07 | Discharge: 2014-02-07 | Disposition: A | Discharge status: Home / Self Care | Payer: BC Managed Care – PPO | Attending: Emergency Medicine | Admitting: Emergency Medicine

## 2014-02-07 DIAGNOSIS — Z87891 Personal history of nicotine dependence: Secondary | ICD-10-CM | POA: Insufficient documentation

## 2014-02-07 DIAGNOSIS — Z3202 Encounter for pregnancy test, result negative: Secondary | ICD-10-CM | POA: Insufficient documentation

## 2014-02-07 DIAGNOSIS — B373 Candidiasis of vulva and vagina: Secondary | ICD-10-CM

## 2014-02-07 DIAGNOSIS — B3731 Acute candidiasis of vulva and vagina: Secondary | ICD-10-CM | POA: Insufficient documentation

## 2014-02-07 DIAGNOSIS — Z8659 Personal history of other mental and behavioral disorders: Secondary | ICD-10-CM | POA: Insufficient documentation

## 2014-02-07 DIAGNOSIS — Z87448 Personal history of other diseases of urinary system: Secondary | ICD-10-CM | POA: Insufficient documentation

## 2014-02-07 LAB — WET PREP, GENITAL
Clue Cells Wet Prep HPF POC: NONE SEEN
Trich, Wet Prep: NONE SEEN
Yeast Wet Prep HPF POC: NONE SEEN

## 2014-02-07 LAB — URINALYSIS, ROUTINE W REFLEX MICROSCOPIC
Bilirubin Urine: NEGATIVE
Glucose, UA: NEGATIVE mg/dL
Hgb urine dipstick: NEGATIVE
Ketones, ur: NEGATIVE mg/dL
Leukocytes, UA: NEGATIVE
Nitrite: NEGATIVE
Protein, ur: NEGATIVE mg/dL
Specific Gravity, Urine: >1.03 — ABNORMAL HIGH (ref 1.005–1.030)
Urobilinogen, UA: 0.2 mg/dL (ref 0.0–1.0)
pH: 6 (ref 5.0–8.0)

## 2014-02-07 LAB — PREGNANCY, URINE: Preg Test, Ur: NEGATIVE

## 2014-02-07 MED ORDER — DIPHENHYDRAMINE HCL 25 MG PO CAPS: 25.0000 mg | ORAL_CAPSULE | Freq: Four times a day (QID) | ORAL | Status: DC | PRN

## 2014-02-07 MED ORDER — FLUCONAZOLE 100 MG PO TABS
100.0000 mg | ORAL_TABLET | Freq: Once | ORAL | Status: AC
  Administered 2014-02-07: 100 mg via ORAL
  Filled 2014-02-07: qty 1

## 2014-02-07 NOTE — ED Provider Notes (Signed)
CSN: [id number]     Arrival date & time 02/07/14  0103 History   First MD Initiated Contact with Patient 02/07/14 0113     Chief Complaint  Patient presents with  . Vaginal Itching     (Consider location/radiation/quality/duration/timing/severity/associated sxs/prior Treatment) HPI History per patient. Vaginal itching ongoing for the last 2 weeks, worse over the last 24 hours and now with rash. Has also had some white vaginal discharge. No pelvic pain or abdominal pain. No back pain. No dysuria, urgency or frequency. No fevers or chills. Complains of discomfort but denies any painful lesions. Symptoms moderate in severity. Past Medical History  Diagnosis Date  . Panic attacks   . Renal disorder    Past Surgical History  Procedure Laterality Date  . Cholecystectomy    . Myringotomy     History reviewed. No pertinent family history. History  Substance Use Topics  . Smoking status: Former Games developermoker  . Smokeless tobacco: Not on file  . Alcohol Use: Yes     Comment: weekends   OB History   Grav Para Term Preterm Abortions TAB SAB Ect Mult Living                 Review of Systems  Constitutional: Negative for fever and chills.  Respiratory: Negative for shortness of breath.   Cardiovascular: Negative for chest pain.  Gastrointestinal: Negative for abdominal pain.  Genitourinary: Negative for dysuria and pelvic pain.  Musculoskeletal: Negative for back pain.  Skin: Positive for rash.  Neurological: Negative for headaches.  All other systems reviewed and are negative.      Allergies  Dilaudid and Zithromax  Home Medications   Current Outpatient Rx  Name  Route  Sig  Dispense  Refill  . ibuprofen (ADVIL,MOTRIN) 600 MG tablet   Oral   Take 1 tablet (600 mg total) by mouth every 6 (six) hours as needed for pain.   30 tablet   0   . medroxyPROGESTERone (DEPO-PROVERA) 150 MG/ML injection   Intramuscular   Inject 150 mg into the muscle every 3 (three) months.         . metroNIDAZOLE (FLAGYL) 500 MG tablet   Oral   Take 500 mg by mouth 2 (two) times daily. 7 day course.          BP 140/97  Pulse 87  Temp(Src) 98 F (36.7 C) (Oral)  Resp 17  Ht 5\' 3"  (1.6 m)  Wt 130 lb (58.968 kg)  BMI 23.03 kg/m2  SpO2 100%  LMP 12/03/2013 Physical Exam  Constitutional: She is oriented to person, place, and time. She appears well-developed and well-nourished.  HENT:  Head: Normocephalic and atraumatic.  Eyes: EOM are normal. Pupils are equal, round, and reactive to light.  Neck: Neck supple.  Cardiovascular: Normal rate, regular rhythm and intact distal pulses.   Pulmonary/Chest: Effort normal and breath sounds normal. No respiratory distress.  Abdominal: Soft. She exhibits no distension. There is no tenderness.  Genitourinary:  Pelvic exam: External GU areas of erythema without vesicular lesions, moderate white discharge. No cervical motion tenderness. No adnexal tenderness.  Musculoskeletal: Normal range of motion. She exhibits no edema.  Neurological: She is alert and oriented to person, place, and time.  Skin: Skin is warm and dry.    ED Course  Procedures (including critical care time) Labs Review Labs Reviewed  WET PREP, GENITAL - Abnormal; Notable for the following:    WBC, Wet Prep HPF POC FEW (*)    All other  components within normal limits  URINALYSIS, ROUTINE W REFLEX MICROSCOPIC - Abnormal; Notable for the following:    Specific Gravity, Urine >1.030 (*)    All other components within normal limits  GC/CHLAMYDIA PROBE AMP  PREGNANCY, URINE   Imaging Review No results found.  Diflucan provided   Plan discharge home with outpatient followup for GC Chlamydia culture results. OB/GYN referral. Return precautions provided verbalized as understood. Patient will take over-the-counter Benadryl as needed  MDM   Diagnosis: Vaginal rash  Intensely itchy rash with erythematous lesions, treated for candidal infection Pelvic exam performed  and cultures pending Vital signs and nursing notes reviewed and considered   Donna Nielsen, MD 02/07/14 [phone]

## 2014-02-07 NOTE — ED Notes (Signed)
Pt denies pain, complaining of itching, family at the bedside.

## 2014-02-07 NOTE — Discharge Instructions (Signed)
Candidal Vulvovaginitis Candidal vulvovaginitis is an infection of the vagina and vulva. The vulva is the skin around the opening of the vagina. This may cause itching and discomfort in and around the vagina.  HOME CARE  Only take medicine as told by your doctor.  Do not have sex (intercourse) until the infection is healed or as told by your doctor.  Practice safe sex.  Tell your sex partner about your infection.  Do not douche or use tampons.  Wear cotton underwear. Do not wear tight pants or panty hose.  Eat yogurt. This may help treat and prevent yeast infections. GET HELP RIGHT AWAY IF:   You have a fever.  Your problems get worse during treatment or do not get better in 3 days.  You have discomfort, irritation, or itching in your vagina or vulva area.  You have pain after sex.  You start to get belly (abdominal) pain. MAKE SURE YOU:  Understand these instructions.  Will watch your condition.  Will get help right away if you are not doing well or get worse. Document Released: 02/13/2009 Document Revised: 02/09/2012 Document Reviewed: 02/13/2009 [facility] Patient Information 2014 [place], [place]. Cutaneous Candidiasis Cutaneous candidiasis is a condition in which there is an overgrowth of yeast (candida) on the skin. Yeast normally live on the skin, but in small enough numbers not to cause any symptoms. In certain cases, increased growth of the yeast may cause an actual yeast infection. This kind of infection usually occurs in areas of the skin that are constantly warm and moist, such as the armpits or the groin.  CAUSES   The fungus that most often causes cutaneous candidiasis is Candida albicans.  SYMPTOMS   Red, swollen area of the skin.  Bumps on the skin.  Itchiness. DIAGNOSIS  The diagnosis of cutaneous candidiasis is usually based on its appearance. Light scrapings of the skin may also be taken and viewed under a microscope to identify the presence of  yeast. TREATMENT  Antifungal creams may be applied to the infected skin. In severe cases, oral medicines may be needed.  HOME CARE INSTRUCTIONS   Keep your skin clean and dry.  Maintain a healthy weight.  If you have diabetes, keep your blood sugar under control. SEEK IMMEDIATE MEDICAL CARE IF:  Your rash continues to spread despite treatment.  You have a fever, chills, or abdominal pain. Document Released: 08/05/2011 Document Revised: 02/09/2012 Document Reviewed: 08/05/2011 [facility] Patient Information 2014 [place], [place].

## 2014-02-07 NOTE — ED Notes (Signed)
Pt c/o vaginal itching x 2 weeks. 

## 2014-02-08 LAB — GC/CHLAMYDIA PROBE AMP
CT Probe RNA: NEGATIVE
GC Probe RNA: NEGATIVE

## 2014-08-28 ENCOUNTER — Emergency Department: Payer: Self-pay | Admitting: Emergency Medicine

## 2014-08-29 LAB — URINALYSIS, COMPLETE
Bilirubin,UR: NEGATIVE
Blood: NEGATIVE
Glucose,UR: NEGATIVE mg/dL (ref 0–75)
Ketone: NEGATIVE
Leukocyte Esterase: NEGATIVE
Nitrite: NEGATIVE
Ph: 6 (ref 4.5–8.0)
Protein: NEGATIVE
RBC,UR: 13 /HPF (ref 0–5)
Specific Gravity: 1.021 (ref 1.003–1.030)
Squamous Epithelial: <1
WBC UR: 3 /HPF (ref 0–5)

## 2014-08-29 LAB — COMPREHENSIVE METABOLIC PANEL
Albumin: 3.9 g/dL (ref 3.4–5.0)
Alkaline Phosphatase: 49 U/L
Anion Gap: 5 — ABNORMAL LOW (ref 7–16)
BUN: 10 mg/dL (ref 7–18)
Bilirubin,Total: 0.4 mg/dL (ref 0.2–1.0)
Calcium, Total: 8.7 mg/dL (ref 8.5–10.1)
Chloride: 109 mmol/L — ABNORMAL HIGH (ref 98–107)
Co2: 25 mmol/L (ref 21–32)
Creatinine: 0.88 mg/dL (ref 0.60–1.30)
Glucose: 80 mg/dL (ref 65–99)
Osmolality: 276 (ref 275–301)
Potassium: 3.7 mmol/L (ref 3.5–5.1)
SGOT(AST): 11 U/L — ABNORMAL LOW (ref 15–37)
SGPT (ALT): 12 U/L — ABNORMAL LOW
Sodium: 139 mmol/L (ref 136–145)
Total Protein: 7.3 g/dL (ref 6.4–8.2)

## 2014-08-29 LAB — CBC
HCT: 40.9 % (ref 35.0–47.0)
HGB: 13.5 g/dL (ref 12.0–16.0)
MCH: 30 pg (ref 26.0–34.0)
MCHC: 33 g/dL (ref 32.0–36.0)
MCV: 91 fL (ref 80–100)
Platelet: 223 10*3/uL (ref 150–440)
RBC: 4.5 10*6/uL (ref 3.80–5.20)
RDW: 13 % (ref 11.5–14.5)
WBC: 8.1 10*3/uL (ref 3.6–11.0)

## 2014-08-29 LAB — GC/CHLAMYDIA PROBE AMP

## 2014-08-29 LAB — WET PREP, GENITAL

## 2014-08-29 LAB — LIPASE, BLOOD: Lipase: 151 U/L (ref 73–393)

## 2014-08-30 ENCOUNTER — Emergency Department: Payer: Self-pay | Admitting: Emergency Medicine

## 2014-08-30 LAB — BASIC METABOLIC PANEL
Anion Gap: 6 — ABNORMAL LOW (ref 7–16)
BUN: 9 mg/dL (ref 7–18)
Calcium, Total: 8.8 mg/dL (ref 8.5–10.1)
Chloride: 110 mmol/L — ABNORMAL HIGH (ref 98–107)
Co2: 26 mmol/L (ref 21–32)
Creatinine: 0.88 mg/dL (ref 0.60–1.30)
EGFR (African American): >60
EGFR (Non-African Amer.): >60
Glucose: 70 mg/dL (ref 65–99)
Osmolality: 280 (ref 275–301)
Potassium: 3.9 mmol/L (ref 3.5–5.1)
Sodium: 142 mmol/L (ref 136–145)

## 2014-08-30 LAB — URINALYSIS, COMPLETE
Bilirubin,UR: NEGATIVE
Blood: NEGATIVE
Glucose,UR: NEGATIVE mg/dL (ref 0–75)
Ketone: NEGATIVE
Nitrite: NEGATIVE
Ph: 6 (ref 4.5–8.0)
Protein: NEGATIVE
RBC,UR: 2 /HPF (ref 0–5)
Specific Gravity: 1.024 (ref 1.003–1.030)
Squamous Epithelial: 17
WBC UR: 19 /HPF (ref 0–5)

## 2014-08-30 LAB — CBC WITH DIFFERENTIAL/PLATELET
Basophil #: 0 10*3/uL (ref 0.0–0.1)
Basophil %: 0.4 %
Eosinophil #: 0.1 10*3/uL (ref 0.0–0.7)
Eosinophil %: 1.2 %
HCT: 40.6 % (ref 35.0–47.0)
HGB: 13.2 g/dL (ref 12.0–16.0)
Lymphocyte #: 1.7 10*3/uL (ref 1.0–3.6)
Lymphocyte %: 24.8 %
MCH: 29.6 pg (ref 26.0–34.0)
MCHC: 32.5 g/dL (ref 32.0–36.0)
MCV: 91 fL (ref 80–100)
Monocyte #: 0.4 x10 3/mm (ref 0.2–0.9)
Monocyte %: 5.9 %
Neutrophil #: 4.6 10*3/uL (ref 1.4–6.5)
Neutrophil %: 67.7 %
Platelet: 213 10*3/uL (ref 150–440)
RBC: 4.45 10*6/uL (ref 3.80–5.20)
RDW: 12.8 % (ref 11.5–14.5)
WBC: 6.8 10*3/uL (ref 3.6–11.0)

## 2015-03-14 ENCOUNTER — Emergency Department
Admit: 2015-03-14 | Disposition: A | Discharge status: Home / Self Care | Payer: Self-pay | Admitting: Emergency Medicine

## 2015-03-19 ENCOUNTER — Emergency Department
Admit: 2015-03-19 | Disposition: A | Discharge status: Home / Self Care | Payer: Self-pay | Admitting: Emergency Medicine

## 2015-06-19 ENCOUNTER — Encounter: Payer: Self-pay | Admitting: Emergency Medicine

## 2015-06-19 ENCOUNTER — Emergency Department
Admission: EM | Admit: 2015-06-19 | Discharge: 2015-06-19 | Disposition: A | Discharge status: Home / Self Care | Payer: Self-pay | Attending: Emergency Medicine | Admitting: Emergency Medicine

## 2015-06-19 DIAGNOSIS — Z7951 Long term (current) use of inhaled steroids: Secondary | ICD-10-CM | POA: Insufficient documentation

## 2015-06-19 DIAGNOSIS — Z79899 Other long term (current) drug therapy: Secondary | ICD-10-CM | POA: Insufficient documentation

## 2015-06-19 DIAGNOSIS — Z87891 Personal history of nicotine dependence: Secondary | ICD-10-CM | POA: Insufficient documentation

## 2015-06-19 DIAGNOSIS — H01003 Unspecified blepharitis right eye, unspecified eyelid: Secondary | ICD-10-CM

## 2015-06-19 DIAGNOSIS — H01001 Unspecified blepharitis right upper eyelid: Secondary | ICD-10-CM | POA: Insufficient documentation

## 2015-06-19 MED ORDER — BACITRACIN ZINC 500 UNIT/GM EX OINT
TOPICAL_OINTMENT | CUTANEOUS | Status: AC
Start: 2015-06-19 — End: 2016-06-18

## 2015-06-19 MED ORDER — HYDROXYZINE HCL 50 MG PO TABS: 50.0000 mg | ORAL_TABLET | Freq: Three times a day (TID) | ORAL | Status: DC | PRN

## 2015-06-19 MED ORDER — OLOPATADINE HCL 0.1 % OP SOLN: 1.0000 [drp] | Freq: Two times a day (BID) | OPHTHALMIC | Status: AC

## 2015-06-19 NOTE — Discharge Instructions (Signed)
Blepharitis Blepharitis is redness, soreness, and swelling (inflammation) of one or both eyelids. It may be caused by an allergic reaction or a bacterial infection. Blepharitis may also be associated with reddened, scaly skin (seborrhea) of the scalp and eyebrows. While you sleep, eye discharge may cause your eyelashes to stick together. Your eyelids may itch, burn, swell, and may lose their lashes. These will grow back. Your eyes may become sensitive. Blepharitis may recur and need repeated treatment. If this is the case, you may require further evaluation by an eye specialist (ophthalmologist). HOME CARE INSTRUCTIONS   Keep your hands clean.  Use a clean towel each time you dry your eyelids. Do not use this towel to clean other areas. Do not share a towel or makeup with anyone.  Wash your eyelids with warm water or warm water mixed with a small amount of baby shampoo. Do this twice a day or as often as needed.  Wash your face and eyebrows at least once a day.  Use warm compresses 2 times a day for 10 minutes at a time, or as directed by your caregiver.  Apply antibiotic ointment as directed by your caregiver.  Avoid rubbing your eyes.  Avoid wearing makeup until you get better.  Follow up with your caregiver as directed. SEEK IMMEDIATE MEDICAL CARE IF:   You have pain, redness, or swelling that gets worse or spreads to other parts of your face.  Your vision changes, or you have pain when looking at lights or moving objects.  You have a fever.  Your symptoms continue for longer than 2 to 4 days or become worse. MAKE SURE YOU:   Understand these instructions.  Will watch your condition.  Will get help right away if you are not doing well or get worse. Document Released: 11/14/2000 Document Revised: 02/09/2012 Document Reviewed: 12/25/2010 ExitCare Patient Information 2015 [employer]. This information is not intended to replace advice given to you by your health care  provider. Make sure you discuss any questions you have with your health care provider.  

## 2015-06-19 NOTE — ED Provider Notes (Signed)
[facility] Emergency Department Provider Note  ____________________________________________  Time seen: Approximately 10:48 AM  I have reviewed the triage vital signs and the nursing notes.   HISTORY  Chief Complaint Eye Pain    HPI Donna Wallace is a 24 y.o. female complaining of 2 days of right eyelids edema. Patient denies any injury she also denies any use of contact lenses. Patient denies any vision loss. Patient states the redness and edema has increased overnight. Patient denies any fever or chills or URI signs and symptoms. Patient rates her discomfort as a 4/10. Patient denies any provocative or palliative measures.   Past Medical History  Diagnosis Date  . Panic attacks   . Renal disorder     There are no active problems to display for this patient.   Past Surgical History  Procedure Laterality Date  . Cholecystectomy    . Myringotomy      Current Outpatient Rx  Name  Route  Sig  Dispense  Refill  . bacitracin ointment      Apply to affected area daily   30 g   0   . diphenhydrAMINE (BENADRYL) 25 mg capsule   Oral   Take 1 capsule (25 mg total) by mouth every 6 (six) hours as needed.   30 capsule   0   . hydrOXYzine (ATARAX/VISTARIL) 50 MG tablet   Oral   Take 1 tablet (50 mg total) by mouth 3 (three) times daily as needed.   30 tablet   0   . ibuprofen (ADVIL,MOTRIN) 600 MG tablet   Oral   Take 1 tablet (600 mg total) by mouth every 6 (six) hours as needed for pain.   30 tablet   0   . medroxyPROGESTERone (DEPO-PROVERA) 150 MG/ML injection   Intramuscular   Inject 150 mg into the muscle every 3 (three) months.         . metroNIDAZOLE (FLAGYL) 500 MG tablet   Oral   Take 500 mg by mouth 2 (two) times daily. 7 day course.           Allergies Dilaudid and Zithromax  No family history on file.  Social History History  Substance Use Topics  . Smoking status: Former Games developermoker  . Smokeless tobacco: Not on  file  . Alcohol Use: Yes     Comment: weekends    Review of Systems Constitutional: No fever/chills Eyes: No visual changes. Right eyelid swelling ENT: No sore throat. Cardiovascular: Denies chest pain. Respiratory: Denies shortness of breath. Gastrointestinal: No abdominal pain.  No nausea, no vomiting.  No diarrhea.  No constipation. Genitourinary: Negative for dysuria. Musculoskeletal: Negative for back pain. Skin: Negative for rash. Neurological: Negative for headaches, focal weakness or numbness. Allergic/Immunilogical: See medication list. 10-point ROS otherwise negative.  ____________________________________________   PHYSICAL EXAM:  VITAL SIGNS: ED Triage Vitals  Enc Vitals Group     BP 06/19/15 1037 134/91 mmHg     Pulse Rate 06/19/15 1037 88     Resp 06/19/15 1037 16     Temp 06/19/15 1037 98.2 F (36.8 C)     Temp Source 06/19/15 1037 Oral     SpO2 06/19/15 1037 96 %     Weight 06/19/15 1037 140 lb (63.504 kg)     Height 06/19/15 1037 5\' 3"  (1.6 m)     Head Cir --      Peak Flow --      Pain Score 06/19/15 1037 4  Pain Loc --      Pain Edu? --      Excl. in GC? --    Constitutional: Alert and oriented. Well appearing and in no acute distress. Eyes: He edema and erythema to the right upper eyelid. Conjunctivae are normal. PERRL. EOMI. Head: Atraumatic. Nose: No congestion/rhinnorhea. Mouth/Throat: Mucous membranes are moist.  Oropharynx non-erythematous. Neck: No stridor. No cervical spine tenderness to palpation. Hematological/Lymphatic/Immunilogical: No cervical lymphadenopathy. Cardiovascular: Normal rate, regular rhythm. Grossly normal heart sounds.  Good peripheral circulation. Respiratory: Normal respiratory effort.  No retractions. Lungs CTAB. Gastrointestinal: Soft and nontender. No distention. No abdominal bruits. No CVA tenderness. Musculoskeletal: No lower extremity tenderness nor edema.  No joint effusions. Neurologic:  Normal speech and  language. No gross focal neurologic deficits are appreciated. No gait instability. Skin:  Skin is warm, dry and intact. No rash noted. Psychiatric: Mood and affect are normal. Speech and behavior are normal.  ____________________________________________   LABS (all labs ordered are listed, but only abnormal results are displayed)  Labs Reviewed - No data to display ____________________________________________  EKG   ____________________________________________  RADIOLOGY   ____________________________________________   PROCEDURES  Procedure(s) performed: None  Critical Care performed: No  ____________________________________________   INITIAL IMPRESSION / ASSESSMENT AND PLAN / ED COURSE  Pertinent labs & imaging results that were available during my care of the patient were reviewed by me and considered in my medical decision making (see chart for details).  Blepharitis right eye. Discussed the sequela and normal structure for this complaint. Patient get a prescription for Patanol, Atarax, and bacitracin. Patient advised follow with the [facility] if no improvement in 2-3 days or worsening of her complaint ____________________________________________   FINAL CLINICAL IMPRESSION(S) / ED DIAGNOSES  Final diagnoses:  Blepharitis of eyelid of right eye      Joni Reining, PA-C 06/19/15 1107  Emily Filbert, MD 06/19/15 1239

## 2015-06-19 NOTE — ED Notes (Signed)
Pt reports she woke up this morning with right eyelid swelling. Pt denies injury.

## 2015-11-15 ENCOUNTER — Emergency Department
Admission: EM | Admit: 2015-11-15 | Discharge: 2015-11-15 | Disposition: A | Discharge status: Home / Self Care | Payer: Self-pay | Attending: Emergency Medicine | Admitting: Emergency Medicine

## 2015-11-15 ENCOUNTER — Encounter: Payer: Self-pay | Admitting: Emergency Medicine

## 2015-11-15 DIAGNOSIS — N898 Other specified noninflammatory disorders of vagina: Secondary | ICD-10-CM | POA: Insufficient documentation

## 2015-11-15 DIAGNOSIS — G8918 Other acute postprocedural pain: Secondary | ICD-10-CM | POA: Insufficient documentation

## 2015-11-15 DIAGNOSIS — F410 Panic disorder [episodic paroxysmal anxiety] without agoraphobia: Secondary | ICD-10-CM | POA: Insufficient documentation

## 2015-11-15 DIAGNOSIS — Z87891 Personal history of nicotine dependence: Secondary | ICD-10-CM | POA: Insufficient documentation

## 2015-11-15 DIAGNOSIS — Z79899 Other long term (current) drug therapy: Secondary | ICD-10-CM | POA: Insufficient documentation

## 2015-11-15 DIAGNOSIS — Z792 Long term (current) use of antibiotics: Secondary | ICD-10-CM | POA: Insufficient documentation

## 2015-11-15 DIAGNOSIS — Z3202 Encounter for pregnancy test, result negative: Secondary | ICD-10-CM | POA: Insufficient documentation

## 2015-11-15 LAB — CBC WITH DIFFERENTIAL/PLATELET
Basophils Absolute: 0 10*3/uL (ref 0–0.1)
Basophils Relative: 0 %
Eosinophils Absolute: 0.1 10*3/uL (ref 0–0.7)
Eosinophils Relative: 1 %
HCT: 41.6 % (ref 35.0–47.0)
Hemoglobin: 13.6 g/dL (ref 12.0–16.0)
Lymphocytes Relative: 29 %
Lymphs Abs: 2 10*3/uL (ref 1.0–3.6)
MCH: 29.2 pg (ref 26.0–34.0)
MCHC: 32.7 g/dL (ref 32.0–36.0)
MCV: 89.3 fL (ref 80.0–100.0)
Monocytes Absolute: 0.3 10*3/uL (ref 0.2–0.9)
Monocytes Relative: 4 %
Neutro Abs: 4.5 10*3/uL (ref 1.4–6.5)
Neutrophils Relative %: 66 %
Platelets: 225 10*3/uL (ref 150–440)
RBC: 4.65 MIL/uL (ref 3.80–5.20)
RDW: 12.8 % (ref 11.5–14.5)
WBC: 6.8 10*3/uL (ref 3.6–11.0)

## 2015-11-15 LAB — COMPREHENSIVE METABOLIC PANEL
ALT: 8 U/L — ABNORMAL LOW (ref 14–54)
AST: 15 U/L (ref 15–41)
Albumin: 4.5 g/dL (ref 3.5–5.0)
Alkaline Phosphatase: 42 U/L (ref 38–126)
Anion gap: 7 (ref 5–15)
BUN: 10 mg/dL (ref 6–20)
CO2: 26 mmol/L (ref 22–32)
Calcium: 9.3 mg/dL (ref 8.9–10.3)
Chloride: 108 mmol/L (ref 101–111)
Creatinine, Ser: 0.78 mg/dL (ref 0.44–1.00)
GFR calc Af Amer: >60 mL/min (ref >60)
GFR calc non Af Amer: >60 mL/min (ref >60)
Glucose, Bld: 106 mg/dL — ABNORMAL HIGH (ref 65–99)
Potassium: 3.7 mmol/L (ref 3.5–5.1)
Sodium: 141 mmol/L (ref 135–145)
Total Bilirubin: 0.8 mg/dL (ref 0.3–1.2)
Total Protein: 7.6 g/dL (ref 6.5–8.1)

## 2015-11-15 LAB — URINALYSIS COMPLETE WITH MICROSCOPIC (ARMC ONLY)
Bacteria, UA: NONE SEEN
Bilirubin Urine: NEGATIVE
Glucose, UA: NEGATIVE mg/dL
Ketones, ur: NEGATIVE mg/dL
Leukocytes, UA: NEGATIVE
Nitrite: NEGATIVE
Protein, ur: NEGATIVE mg/dL
Specific Gravity, Urine: 1.019 (ref 1.005–1.030)
pH: 5 (ref 5.0–8.0)

## 2015-11-15 LAB — WET PREP, GENITAL
Clue Cells Wet Prep HPF POC: NONE SEEN
Sperm: NONE SEEN
Trich, Wet Prep: NONE SEEN
Yeast Wet Prep HPF POC: NONE SEEN

## 2015-11-15 LAB — CHLAMYDIA/NGC RT PCR (ARMC ONLY)
Chlamydia Tr: NOT DETECTED
N gonorrhoeae: NOT DETECTED

## 2015-11-15 LAB — POCT PREGNANCY, URINE: Preg Test, Ur: NEGATIVE

## 2015-11-15 MED ORDER — NAPROXEN 500 MG PO TABS: 500.0000 mg | ORAL_TABLET | Freq: Two times a day (BID) | ORAL | Status: DC

## 2015-11-15 MED ORDER — KETOROLAC TROMETHAMINE 30 MG/ML IJ SOLN
30.0000 mg | Freq: Once | INTRAMUSCULAR | Status: AC
  Administered 2015-11-15: 30 mg via INTRAMUSCULAR
  Filled 2015-11-15: qty 1

## 2015-11-15 NOTE — ED Notes (Signed)
[facility], states today lower abd. Pain and lower back pain has gotten worse and she had a vaginal discharge

## 2015-11-15 NOTE — Discharge Instructions (Signed)
Pain Relief Preoperatively and Postoperatively  If you have questions, problems, or concerns about the pain that you may feel after surgery, let your health care provider know. Patients have the right to assessment and management of pain. Severe pain after surgery--and the fear or anxiety associated with that pain--may cause extreme discomfort that:  · Prevents sleep.  · Decreases the ability to breathe deeply and to cough. This can result in pneumonia or other upper airway infections.  · Causes the heart to beat more quickly and the blood pressure to be higher.  · Increases the risk for constipation and bloating.  · Decreases the ability of wounds to heal.  · May result in depression, increased anxiety, and feelings of helplessness.  Relieving pain before surgery (preoperatively) is also important because it lessens pain that you have after surgery (postoperatively). Patients who receive pain relief both before and after surgery experience greater pain relief than those who receive pain relief only after surgery. Let your health care provider know if you are having uncontrolled pain. This is very important. Pain after surgery is more difficult to manage if it is severe, so receiving prompt and adequate treatment of acute pain is necessary. If you become constipated after taking pain medicine, drink more liquids if you can. Your health care provider may have you take a mild laxative.  PAIN CONTROL METHODS  Your health care providers follow policies and procedures about the management of your pain. These guidelines should be explained to you before surgery. Plans for pain control after surgery must be decided upon by you and your health care provider and put into use with your full understanding and agreement. Do not be afraid to ask questions about the care that you are receiving.  Your health care providers will attempt to control your pain in various ways, and these methods may be used together (multimodal  analgesia). Using this approach has many benefits for you, including being able to eat, move around, and leave the hospital sooner.  As-Needed Pain Control  · You may be given pain medicine through an IV tube or as a pill or liquid that you can swallow. Let your health care provider know when you are having pain, and he or she will give you the pain medicine that is ordered for you.  IV Patient-Controlled Analgesia (PCA) Pump  · You can receive your pain medicine through an IV tube that goes into one of your veins. You can control the amount of pain medicine that you get. The pain medicine is controlled by a pump. When you push the button that is hooked up to this pump, you receive a specific amount of pain medicine. This button should be pushed only by you or by someone who is specifically assigned by you to do so. It is set up to keep you from accidentally giving yourself too much pain medicine. You will be able to start using your pain pump in the recovery room after your surgery. This method can be helpful for most types of surgery.  · Tell your health care provider:    If you are having too much pain.    If you are feeling too sleepy or nauseous.  Continuous Epidural Pain Control  · A thin, soft tube (catheter) is put into your back, outside the outer layer of your spinal cord. Pain medicine flows through the catheter to lessen pain in areas of your body that are below the level of catheter placement. Continuous epidural pain control may work best for you   if you are having surgery on your abdomen, hip area, or legs. The epidural catheter is usually put into your back shortly before surgery. It is left in until you can eat, take medicine by mouth, pass urine, and have a bowel movement.  · Giving pain medicine through the epidural catheter may help you to heal more quickly because you can do these things sooner:    Regain normal bowel and bladder function.    Return to eating.    Get up and walk.  Medicine That  Numbs the Area (Local Anesthetic)  You may be given pain medicine:  · As an injection near the area of the pain (local infiltration).  · As an injection near the nerve that controls the sensation to a specific part of your body (peripheral nerve block).  · In your spine to block pain (spinal block).  · Through a local anesthetic reservoir pump. If your surgeon or anesthesiologist selects this option as a part of your pain control, one or more thin, soft tubes will be inserted into your incision site(s) at the end of surgery. These tubes will be connected to a device that is filled with a non-narcotic pain medicine. This medicine gradually empties into your incision site over the next several days. Usually, after all of the medicine is used, your health care provider will remove the tubes and throw away the device.  Opioids  · Moderate to moderately severe acute pain after surgery may respond to opioids. Opioids are narcotic pain medicine. Opioids are often combined with non-narcotic medicines to improve pain relief, lower the risk of side effects, and reduce the chance of addiction.  · If you follow your health care provider's directions about taking opioids and you do not have a history of substance abuse, your risk of becoming addicted is very small. To prevent addiction, opioids are given for short periods of time in careful doses.  Other Methods of Pain Control  · Steroids.  · Physical therapy.  · Heat and cold therapy.  · Compression, such as wrapping an elastic bandage around the area of the pain.  · Massage.     This information is not intended to replace advice given to you by your health care provider. Make sure you discuss any questions you have with your health care provider.     Document Released: 02/07/2003 Document Revised: 12/08/2014 Document Reviewed: 02/11/2011  Elsevier Interactive Patient Education ©2016 Elsevier Inc.

## 2015-11-15 NOTE — ED Provider Notes (Signed)
[facility] Emergency Department Provider Note  ____________________________________________  Time seen: 2 PM  I have reviewed the triage vital signs and the nursing notes.   HISTORY  Chief Complaint Abdominal Pain    HPI Foye DeerOndrea J Sawyer is a 24 y.o. female who complains of lower abdominal pain and vaginal discharge status post [facility]. She reports she had no pain or vaginal discharge prior to the procedure but overnight developed crampy sensation in her lower pelvis bilaterally. She denies fevers or chills. She tended to get a hold of [facility] but was unable to speak anyone. She denies dysuria     Past Medical History  Diagnosis Date  . Panic attacks   . Renal disorder     There are no active problems to display for this patient.   Past Surgical History  Procedure Laterality Date  . Cholecystectomy    . Myringotomy      Current Outpatient Rx  Name  Route  Sig  Dispense  Refill  . bacitracin ointment      Apply to affected area daily   30 g   0   . diphenhydrAMINE (BENADRYL) 25 mg capsule   Oral   Take 1 capsule (25 mg total) by mouth every 6 (six) hours as needed.   30 capsule   0   . hydrOXYzine (ATARAX/VISTARIL) 50 MG tablet   Oral   Take 1 tablet (50 mg total) by mouth 3 (three) times daily as needed.   30 tablet   0   . ibuprofen (ADVIL,MOTRIN) 600 MG tablet   Oral   Take 1 tablet (600 mg total) by mouth every 6 (six) hours as needed for pain.   30 tablet   0   . medroxyPROGESTERone (DEPO-PROVERA) 150 MG/ML injection   Intramuscular   Inject 150 mg into the muscle every 3 (three) months.         . metroNIDAZOLE (FLAGYL) 500 MG tablet   Oral   Take 500 mg by mouth 2 (two) times daily. 7 day course.         . naproxen (NAPROSYN) 500 MG tablet   Oral   Take 1 tablet (500 mg total) by mouth 2 (two) times daily with a meal.   20 tablet   2   . olopatadine (PATANOL) 0.1 % ophthalmic  solution   Right Eye   Place 1 drop into the right eye 2 (two) times daily.   5 mL   0     Allergies Dilaudid and Zithromax  No family history on file.  Social History Social History  Substance Use Topics  . Smoking status: Former Games developermoker  . Smokeless tobacco: None  . Alcohol Use: Yes     Comment: weekends    Review of Systems  Constitutional: Negative for fever. Eyes: Negative for visual changes. ENT: Negative for sore throat Cardiovascular: Negative for chest pain. Respiratory: Negative for shortness of breath. Gastrointestinal: Negative for vomiting and diarrhea. Genitourinary: Negative for dysuria. Other for vaginal discharge  Musculoskeletal: Negative for back pain. Skin: Negative for rash. Neurological: Negative for headaches or focal weakness Psychiatric: Mild anxiety    ____________________________________________   PHYSICAL EXAM:  VITAL SIGNS: ED Triage Vitals  Enc Vitals Group     BP 11/15/15 1341 141/95 mmHg     Pulse Rate 11/15/15 1341 92     Resp 11/15/15 1341 18     Temp 11/15/15 1341 98.2 F (36.8 C)  Temp Source 11/15/15 1341 Oral     SpO2 11/15/15 1341 100 %     Weight 11/15/15 1341 151 lb (68.493 kg)     Height 11/15/15 1341  (1.6 m)     Head Cir --      Peak Flow --      Pain Score 11/15/15 1341 6     Pain Loc --      Pain Edu? --      Excl. in GC? --      Constitutional: Alert and oriented. Well appearing and in no distress. Eyes: Conjunctivae are normal.  ENT   Head: Normocephalic and atraumatic.   Mouth/Throat: Mucous membranes are moist. Cardiovascular: Normal rate, regular rhythm. Normal and symmetric distal pulses are present in all extremities.  Respiratory: Normal respiratory effort without tachypnea nor retractions.None Gastrointestinal: Mild tenderness to palpation in the lower abdomen. No distention. There is no CVA tenderness. Genitourinary: Thick white mucousy-like discharge, no cervicitis noted, no  CMT Musculoskeletal: Nontender with normal range of motion in all extremities. No lower extremity tenderness nor edema. Neurologic:  Normal speech and language. Skin:  Skin is warm, dry and intact. No rash noted. Psychiatric: Mood and affect are normal. Patient exhibits appropriate insight and judgment.  ____________________________________________    LABS (pertinent positives/negatives)  Labs Reviewed  WET PREP, GENITAL - Abnormal; Notable for the following:    WBC, Wet Prep HPF POC MODERATE (*)    All other components within normal limits  COMPREHENSIVE METABOLIC PANEL - Abnormal; Notable for the following:    Glucose, Bld 106 (*)    ALT 8 (*)    All other components within normal limits  URINALYSIS COMPLETEWITH MICROSCOPIC ([facility] ONLY) - Abnormal; Notable for the following:    Color, Urine YELLOW (*)    APPearance HAZY (*)    Hgb urine dipstick 1+ (*)    Squamous Epithelial / LPF 6-30 (*)    All other components within normal limits  CHLAMYDIA/NGC RT PCR ([facility] ONLY)  CBC WITH DIFFERENTIAL/PLATELET  POCT PREGNANCY, URINE    ____________________________________________   EKG  None  ____________________________________________    RADIOLOGY I have personally reviewed any xrays that were ordered on this patient: None  ____________________________________________   PROCEDURES  Procedure(s) performed: none  Critical Care performed: none  ____________________________________________   INITIAL IMPRESSION / ASSESSMENT AND PLAN / ED COURSE  Pertinent labs & imaging results that were available during my care of the patient were reviewed by me and considered in my medical decision making (see chart for details).  Patient well-appearing and in no distress. Pelvic exam fairly unremarkable post'. Discussed with Dr. Tiburcio Pea of OB/GYN and he agrees that no further testing is necessary as cramping sensation normal after colposcopy, we will give pain medication  which she was not given and have her follow-up with [facility]. Return precautions discussed  ____________________________________________   FINAL CLINICAL IMPRESSION(S) / ED DIAGNOSES  Final diagnoses:  Acute post-operative pain     Jene Every, MD 11/15/15 1620

## 2015-11-15 NOTE — ED Notes (Signed)
Pt a/o, GCS 15. Low, mid abd pain with bilateral flank pain s/p procedure 12/14. Denies changes in bowel or bladder.

## 2016-02-25 ENCOUNTER — Encounter: Payer: Self-pay | Admitting: Emergency Medicine

## 2016-02-25 ENCOUNTER — Emergency Department
Admission: EM | Admit: 2016-02-25 | Discharge: 2016-02-25 | Disposition: A | Discharge status: Home / Self Care | Payer: Self-pay | Attending: Emergency Medicine | Admitting: Emergency Medicine

## 2016-02-25 DIAGNOSIS — Z791 Long term (current) use of non-steroidal anti-inflammatories (NSAID): Secondary | ICD-10-CM | POA: Insufficient documentation

## 2016-02-25 DIAGNOSIS — Z792 Long term (current) use of antibiotics: Secondary | ICD-10-CM | POA: Insufficient documentation

## 2016-02-25 DIAGNOSIS — Z3202 Encounter for pregnancy test, result negative: Secondary | ICD-10-CM | POA: Insufficient documentation

## 2016-02-25 DIAGNOSIS — Z87891 Personal history of nicotine dependence: Secondary | ICD-10-CM | POA: Insufficient documentation

## 2016-02-25 DIAGNOSIS — Z79899 Other long term (current) drug therapy: Secondary | ICD-10-CM | POA: Insufficient documentation

## 2016-02-25 DIAGNOSIS — N938 Other specified abnormal uterine and vaginal bleeding: Secondary | ICD-10-CM | POA: Insufficient documentation

## 2016-02-25 LAB — CHLAMYDIA/NGC RT PCR (ARMC ONLY)
Chlamydia Tr: NOT DETECTED
N gonorrhoeae: NOT DETECTED

## 2016-02-25 LAB — WET PREP, GENITAL
Clue Cells Wet Prep HPF POC: NONE SEEN
Sperm: NONE SEEN
Trich, Wet Prep: NONE SEEN
WBC, Wet Prep HPF POC: NONE SEEN
Yeast Wet Prep HPF POC: NONE SEEN

## 2016-02-25 LAB — POCT PREGNANCY, URINE: Preg Test, Ur: NEGATIVE

## 2016-02-25 NOTE — ED Provider Notes (Signed)
[facility] Emergency Department Provider Note  ____________________________________________  Time seen: 7:30 AM  I have reviewed the triage vital signs and the nursing notes.   HISTORY  Chief Complaint Vaginal Bleeding    HPI Donna Wallace is a 25 y.o. female who complains of vaginal bleeding for 2 days. Her periods are normally regular lasting 3-4 days and relatively light. She had a period about 2 weeks ago that was occurring at the normal time but was heavier and lasted longer for a total of 7 days. 2 days ago she started bleeding again which she reports is relatively heavy.  She is to be on hormonal contraceptives but discontinued that 2 months ago. She has been sexually active recently and does not use condoms. Denies any discharge fevers chest pain shortness of breath or vomiting. She does have some pelvic cramping pain.     Past Medical History  Diagnosis Date  . Panic attacks   . Renal disorder      There are no active problems to display for this patient.    Past Surgical History  Procedure Laterality Date  . Cholecystectomy    . Myringotomy       Current Outpatient Rx  Name  Route  Sig  Dispense  Refill  . bacitracin ointment      Apply to affected area daily   30 g   0   . diphenhydrAMINE (BENADRYL) 25 mg capsule   Oral   Take 1 capsule (25 mg total) by mouth every 6 (six) hours as needed.   30 capsule   0   . hydrOXYzine (ATARAX/VISTARIL) 50 MG tablet   Oral   Take 1 tablet (50 mg total) by mouth 3 (three) times daily as needed.   30 tablet   0   . ibuprofen (ADVIL,MOTRIN) 600 MG tablet   Oral   Take 1 tablet (600 mg total) by mouth every 6 (six) hours as needed for pain.   30 tablet   0   . medroxyPROGESTERone (DEPO-PROVERA) 150 MG/ML injection   Intramuscular   Inject 150 mg into the muscle every 3 (three) months.         . metroNIDAZOLE (FLAGYL) 500 MG tablet   Oral   Take 500 mg by mouth 2 (two) times  daily. 7 day course.         . naproxen (NAPROSYN) 500 MG tablet   Oral   Take 1 tablet (500 mg total) by mouth 2 (two) times daily with a meal.   20 tablet   2   . olopatadine (PATANOL) 0.1 % ophthalmic solution   Right Eye   Place 1 drop into the right eye 2 (two) times daily.   5 mL   0      Allergies Dilaudid and Zithromax   History reviewed. No pertinent family history.  Social History Social History  Substance Use Topics  . Smoking status: Former Games developermoker  . Smokeless tobacco: None  . Alcohol Use: Yes     Comment: weekends    Review of Systems  Constitutional:   No fever or chills. No weight changes Eyes:   No vision changes.  ENT:   No sore throat. No rhinorrhea. Cardiovascular:   No chest pain. Respiratory:   No dyspnea or cough. Gastrointestinal:   Negative for abdominal pain, vomiting and diarrhea.  No BRBPR or melena. Genitourinary:   Negative for dysuria or difficulty urinating. Musculoskeletal:   Negative for focal pain or swelling  Skin:   Negative for rash. Neurological:   Negative for headaches, focal weakness or numbness.  10-point ROS otherwise negative.  ____________________________________________   PHYSICAL EXAM:  VITAL SIGNS: ED Triage Vitals  Enc Vitals Group     BP 02/25/16 0643 137/87 mmHg     Pulse Rate 02/25/16 0643 69     Resp 02/25/16 0643 18     Temp 02/25/16 0643 97.6 F (36.4 C)     Temp Source 02/25/16 0643 Oral     SpO2 02/25/16 0643 100 %     Weight 02/25/16 0643 150 lb (68.04 kg)     Height 02/25/16 0643  (1.6 m)     Head Cir --      Peak Flow --      Pain Score 02/25/16 0638 7     Pain Loc --      Pain Edu? --      Excl. in GC? --     Vital signs reviewed, nursing assessments reviewed.   Constitutional:   Alert and oriented. Well appearing and in no distress. Eyes:   No scleral icterus. No conjunctival pallor. PERRL. EOMI ENT   Head:   Normocephalic and atraumatic.   Nose:   No  congestion/rhinnorhea. No septal hematoma   Mouth/Throat:   MMM, no pharyngeal erythema. No peritonsillar mass.    Neck:   No stridor. No SubQ emphysema. No meningismus. Hematological/Lymphatic/Immunilogical:   No cervical lymphadenopathy. Gastrointestinal:   Soft and nontender. Non distended. There is no CVA tenderness.  No rebound, rigidity, or guarding. Genitourinary:   Pelvic exam performed with nursing assistant. External exam unremarkable. Speculum exam unremarkable without blood or significant discharge. Bimanual exam unremarkable, no adnexal mass or tenderness, no CMT. Musculoskeletal:   Nontender with normal range of motion in all extremities. No joint effusions.  No lower extremity tenderness.  No edema. Neurologic:   Normal speech and language.  CN 2-10 normal. Motor grossly intact. No gross focal neurologic deficits are appreciated.  Skin:    Skin is warm, dry and intact. No rash noted.  No petechiae, purpura, or bullae. Psychiatric:   Mood and affect are normal. ____________________________________________    LABS (pertinent positives/negatives) (all labs ordered are listed, but only abnormal results are displayed) Labs Reviewed  WET PREP, GENITAL  CHLAMYDIA/NGC RT PCR ([facility] ONLY)  POC URINE PREG, ED  POCT PREGNANCY, URINE   ____________________________________________   EKG    ____________________________________________    RADIOLOGY    ____________________________________________   PROCEDURES   ____________________________________________   INITIAL IMPRESSION / ASSESSMENT AND PLAN / ED COURSE  Pertinent labs & imaging results that were available during my care of the patient were reviewed by me and considered in my medical decision making (see chart for details).  Patient presents with dysfunctional uterine bleeding. No significant blood in the vault on pelvic exam. No evidence of infection, low suspicion for STI or PID. Low  suspicion for torsion TOA or regnancy including ectopic. Pregnancy test is negative. Vital signs are normal. Patient is calm and comfortable. Eyes follow up with gynecology. Likely this is her body adjusting to the withdrawal of chronic hormonal contraceptive use, versus a "failed implantation" type of early pregnancy miscarriage. We'll discharge home, NSAIDs as needed.     ____________________________________________   FINAL CLINICAL IMPRESSION(S) / ED DIAGNOSES  Final diagnoses:  Dysfunctional uterine bleeding      Sharman Cheek, MD 02/25/16 0900

## 2016-02-25 NOTE — ED Notes (Signed)
Pt ambulatory to triage with no difficulty. Pt reports heavy vaginal bleeding and cramping pain to her lower abd and her low back. Pt reports her LMP was 02/11/16 and she bled heavy for those 7 days. Pt reports she started bleeding again on Saturday morning and has been having heavy bleeding since. Pt reports using a tampon about once every 3 hours.

## 2016-02-25 NOTE — ED Notes (Signed)
Patient is resting comfortably. 

## 2016-02-25 NOTE — Discharge Instructions (Signed)
Abnormal Uterine Bleeding °Abnormal uterine bleeding can affect women at various stages in life, including teenagers, women in their reproductive years, pregnant women, and women who have reached menopause. Several kinds of uterine bleeding are considered abnormal, including: °· Bleeding or spotting between periods.   °· Bleeding after sexual intercourse.   °· Bleeding that is heavier or more than normal.   °· Periods that last longer than usual. °· Bleeding after menopause.   °Many cases of abnormal uterine bleeding are minor and simple to treat, while others are more serious. Any type of abnormal bleeding should be evaluated by your health care provider. Treatment will depend on the cause of the bleeding. °HOME CARE INSTRUCTIONS °Monitor your condition for any changes. The following actions may help to alleviate any discomfort you are experiencing: °· Avoid the use of tampons and douches as directed by your health care provider. °· Change your pads frequently. °You should get regular pelvic exams and Pap tests. Keep all follow-up appointments for diagnostic tests as directed by your health care provider.  °SEEK MEDICAL CARE IF:  °· Your bleeding lasts more than 1 week.   °· You feel dizzy at times.   °SEEK IMMEDIATE MEDICAL CARE IF:  °· You pass out.   °· You are changing pads every 15 to 30 minutes.   °· You have abdominal pain. °· You have a fever.   °· You become sweaty or weak.   °· You are passing large blood clots from the vagina.   °· You start to feel nauseous and vomit. °MAKE SURE YOU:  °· Understand these instructions. °· Will watch your condition. °· Will get help right away if you are not doing well or get worse. °  °This information is not intended to replace advice given to you by your health care provider. Make sure you discuss any questions you have with your health care provider. °  °Document Released: 11/17/2005 Document Revised: 11/22/2013 Document Reviewed: 06/16/2013 °Elsevier Interactive  Patient Education ©2016 Elsevier Inc. °Metrorrhagia °Metrorrhagia is bleeding from the uterus that happens irregularly but often. The bleeding generally happens between menstrual periods. °HOME CARE INSTRUCTIONS °Pay attention to any changes in your symptoms. Follow these instructions to help with your condition: °Eating °· Eat well-balanced meals. Include foods that are high in iron, such as liver, meat, shellfish, green leafy vegetables, and eggs. °· If you become constipated: °¨ Drink plenty of water. °¨ Eat fruits and vegetables that are high in water and fiber, such as spinach, carrots, raspberries, apples, and mango. °Medicines °· Take over-the-counter and prescription medicines only as told by your health care provider. °· Do not change medicines without talking with your health care provider. °· Aspirin or medicines that contain aspirin may make the bleeding worse. Do not take those medicines: °¨ During the week before your period. °¨ During your period. °· If you were prescribed iron pills, take them as told by your health care provider. Iron pills help to replace iron that your body loses because of this condition. °Activity °· If you need to change your sanitary pad or tampon more than one time every 2 hours: °¨ Lie in bed with your feet raised (elevated). °¨ Place a cold pack on your lower abdomen. °¨ Rest as much as possible until the bleeding stops or slows down. °· Do not try to lose weight until the bleeding has stopped and your blood iron level is back to normal. °Other Instructions °· For two months, write down: °¨ When your period starts. °¨ When your period ends. °¨   When any abnormal bleeding occurs.  What problems you notice.  Keep all follow-up visits as told by your health care provider. This is important. SEEK MEDICAL CARE IF:  You get light-headed or weak.  You have nausea and vomiting.  You cannot eat or drink without vomiting.  You feel dizzy or have diarrhea while you are  taking medicine.  You are taking birth control pills or hormones, and you want to change them or stop taking them. SEEK IMMEDIATE MEDICAL CARE IF:  You develop a fever or chills.  You need to change your sanitary pad or tampon more than one time per hour.  Your bleeding becomesheavy.  Your flow contains clots.  You develop pain in your abdomen.  You lose consciousness.  You develop a rash.   This information is not intended to replace advice given to you by your health care provider. Make sure you discuss any questions you have with your health care provider.   Document Released: 11/17/2005 Document Revised: 08/08/2015 Document Reviewed: 02/12/2015 Elsevier Interactive Patient Education [employer] Elsevier Inc.

## 2016-02-29 MED ORDER — CEPHALEXIN 500 MG PO CAPS
ORAL_CAPSULE | ORAL | Status: AC
  Filled 2016-02-29: qty 1

## 2016-11-18 ENCOUNTER — Encounter: Payer: Self-pay | Admitting: Emergency Medicine

## 2016-11-18 ENCOUNTER — Emergency Department
Admission: EM | Admit: 2016-11-18 | Discharge: 2016-11-18 | Disposition: A | Discharge status: Home / Self Care | Payer: Self-pay | Attending: Emergency Medicine | Admitting: Emergency Medicine

## 2016-11-18 DIAGNOSIS — Z791 Long term (current) use of non-steroidal anti-inflammatories (NSAID): Secondary | ICD-10-CM | POA: Insufficient documentation

## 2016-11-18 DIAGNOSIS — J111 Influenza due to unidentified influenza virus with other respiratory manifestations: Secondary | ICD-10-CM | POA: Insufficient documentation

## 2016-11-18 DIAGNOSIS — Z79899 Other long term (current) drug therapy: Secondary | ICD-10-CM | POA: Insufficient documentation

## 2016-11-18 DIAGNOSIS — B349 Viral infection, unspecified: Secondary | ICD-10-CM

## 2016-11-18 DIAGNOSIS — R69 Illness, unspecified: Secondary | ICD-10-CM

## 2016-11-18 DIAGNOSIS — Z87891 Personal history of nicotine dependence: Secondary | ICD-10-CM | POA: Insufficient documentation

## 2016-11-18 LAB — INFLUENZA PANEL BY PCR (TYPE A & B)
Influenza A By PCR: NEGATIVE
Influenza B By PCR: NEGATIVE

## 2016-11-18 MED ORDER — ONDANSETRON 4 MG PO TBDP: 4.0000 mg | ORAL_TABLET | Freq: Four times a day (QID) | ORAL | 0 refills | Status: DC | PRN

## 2016-11-18 MED ORDER — BENZONATATE 100 MG PO CAPS: 100.0000 mg | ORAL_CAPSULE | Freq: Three times a day (TID) | ORAL | 0 refills | Status: DC | PRN

## 2016-11-18 NOTE — ED Triage Notes (Signed)
C/O bodyaches, chills, sore throat, and cough x 1 day.  Severe cold and mucous alka seltzer, last this morning at 0730.

## 2016-11-18 NOTE — Discharge Instructions (Signed)
You have a viral illness, that could be the flu even though your test was negative. Continue to monitor and treat symptoms. Hydrate and take cough and cold medicines as needed. [facility] your provider or [facility] as needed.

## 2016-11-18 NOTE — ED Notes (Signed)
AAOx3.  Skin warm and dry.  NAD 

## 2016-11-18 NOTE — ED Provider Notes (Signed)
[facility] Emergency Department Provider Note ____________________________________________  Time seen: 1513  I have reviewed the triage vital signs and the nursing notes.  HISTORY  Chief Complaint  Generalized Body Aches  HPI Donna Wallace is a 25 y.o. female presents to the ED for evaluation of one day complaint of severe body aches, chills, sore throat, and intermittently productive cough. Patient describes onset of symptoms last night beginning with a sore throat. She dosed over-the-counter multisymptom Alka-Seltzer tablet, and denies any significant benefit. She denies any measurable fevers but reports chills. She denies any sick contacts, recent travel, or other exposures. She did not receive the seasonal flu vaccine.  Past Medical History:  Diagnosis Date  . Panic attacks   . Renal disorder     There are no active problems to display for this patient.   Past Surgical History:  Procedure Laterality Date  . CHOLECYSTECTOMY    . MYRINGOTOMY      Prior to Admission medications   Medication Sig Start Date End Date Taking? Authorizing Provider  benzonatate (TESSALON PERLES) 100 MG capsule Take 1 capsule (100 mg total) by mouth 3 (three) times daily as needed for cough (Take 1-2 per dose). 11/18/16   Jenise V Bacon Menshew, PA-C  diphenhydrAMINE (BENADRYL) 25 mg capsule Take 1 capsule (25 mg total) by mouth every 6 (six) hours as needed. 02/07/14   Sunnie NielsenBrian Opitz, MD  hydrOXYzine (ATARAX/VISTARIL) 50 MG tablet Take 1 tablet (50 mg total) by mouth 3 (three) times daily as needed. 06/19/15   Joni Reiningonald K Smith, PA-C  ibuprofen (ADVIL,MOTRIN) 600 MG tablet Take 1 tablet (600 mg total) by mouth every 6 (six) hours as needed for pain. 03/26/13   Burgess AmorJulie Idol, PA-C  medroxyPROGESTERone (DEPO-PROVERA) 150 MG/ML injection Inject 150 mg into the muscle every 3 (three) months.    Historical Provider, MD  metroNIDAZOLE (FLAGYL) 500 MG tablet Take 500 mg by mouth 2 (two) times  daily. 7 day course.    Historical Provider, MD  naproxen (NAPROSYN) 500 MG tablet Take 1 tablet (500 mg total) by mouth 2 (two) times daily with a meal. 11/15/15   Jene Everyobert Kinner, MD  ondansetron (ZOFRAN ODT) 4 MG disintegrating tablet Take 1 tablet (4 mg total) by mouth every 6 (six) hours as needed for nausea or vomiting. 11/18/16   Charlesetta IvoryJenise V Bacon Menshew, PA-C    Allergies Dilaudid [hydromorphone hcl] and Zithromax [azithromycin dihydrate]  No family history on file.  Social History Social History  Substance Use Topics  . Smoking status: Former Games developermoker  . Smokeless tobacco: Never Used  . Alcohol use Yes     Comment: weekends    Review of Systems  Constitutional: Negative for fever. Reports chills.  Eyes: Negative for visual changes. ENT: Positive for sore throat. Cardiovascular: Negative for chest pain. Respiratory: Negative for shortness of breath. Reports cough and congestion. Gastrointestinal: Negative for abdominal pain, vomiting and diarrhea. Reports nausea. Genitourinary: Negative for dysuria. Musculoskeletal: Negative for back pain. Skin: Negative for rash. Neurological: Negative for headaches, focal weakness or numbness. ____________________________________________  PHYSICAL EXAM:  VITAL SIGNS: ED Triage Vitals  Enc Vitals Group     BP 11/18/16 1505 (!) 135/94     Pulse Rate 11/18/16 1505 (!) 107     Resp 11/18/16 1505 16     Temp 11/18/16 1505 98.4 F (36.9 C)     Temp Source 11/18/16 1505 Oral     SpO2 11/18/16 1505 98 %     Weight 11/18/16  1458 155 lb (70.3 kg)     Height 11/18/16 1457 5\' 3"  (1.6 m)     Head Circumference --      Peak Flow --      Pain Score 11/18/16 1503 7     Pain Loc --      Pain Edu? --      Excl. in GC? --     Constitutional: Alert and oriented. Well appearing and in no distress. Head: Normocephalic and atraumatic. Eyes: Conjunctivae are normal. PERRL. Normal extraocular movements Ears: Canals clear. TMs intact  bilaterally. Nose: No congestion/rhinorrhea/epistaxis. Mouth/Throat: Mucous membranes are moist. Uvula is midline and tonsils are flat. No significant oropharyngeal erythema, edema, or exudates noted.  Neck: Supple. No thyromegaly. Hematological/Lymphatic/Immunological: No cervical lymphadenopathy. Cardiovascular: Normal rate, regular rhythm. Normal distal pulses. Respiratory: Normal respiratory effort. No wheezes/rales/rhonchi. Gastrointestinal: Soft and nontender. No distention. Musculoskeletal: Nontender with normal range of motion in all extremities.  Neurologic:  Normal gait without ataxia. Normal speech and language. No gross focal neurologic deficits are appreciated. Skin:  Skin is warm, dry and intact. No rash noted. ____________________________________________   LABS (pertinent positives/negatives) Labs Reviewed  INFLUENZA PANEL BY PCR (TYPE A & B, H1N1)  ____________________________________________  INITIAL IMPRESSION / ASSESSMENT AND PLAN / ED COURSE  Patient with an influenza-like illness on presentation. She is reassured by her negative influenza test in the ED. She is discharged with instructions to continue to treat symptoms as appropriate. She'll be discharged with a prescription for Tessalon Perles and Zofran to dose as needed. She'll follow with her primary care provider or local community clinic as needed. A work note is provider for 1 day as requested.  Clinical Course    ____________________________________________  FINAL CLINICAL IMPRESSION(S) / ED DIAGNOSES  Final diagnoses:  Influenza-like illness  Viral illness      Lissa HoardJenise V Bacon Menshew, PA-C 11/18/16 1702    Sharyn CreamerMark Quale, MD 11/18/16 2029

## 2017-01-27 ENCOUNTER — Emergency Department
Admission: EM | Admit: 2017-01-27 | Discharge: 2017-01-27 | Disposition: A | Discharge status: Home / Self Care | Payer: Self-pay | Attending: Emergency Medicine | Admitting: Emergency Medicine

## 2017-01-27 ENCOUNTER — Encounter: Payer: Self-pay | Admitting: *Deleted

## 2017-01-27 DIAGNOSIS — L500 Allergic urticaria: Secondary | ICD-10-CM | POA: Insufficient documentation

## 2017-01-27 DIAGNOSIS — T7840XA Allergy, unspecified, initial encounter: Secondary | ICD-10-CM

## 2017-01-27 DIAGNOSIS — Z87891 Personal history of nicotine dependence: Secondary | ICD-10-CM | POA: Insufficient documentation

## 2017-01-27 DIAGNOSIS — L509 Urticaria, unspecified: Secondary | ICD-10-CM

## 2017-01-27 MED ORDER — DIPHENHYDRAMINE HCL 50 MG/ML IJ SOLN
25.0000 mg | Freq: Once | INTRAMUSCULAR | Status: AC
  Administered 2017-01-27: 25 mg via INTRAVENOUS
  Filled 2017-01-27: qty 1

## 2017-01-27 MED ORDER — RANITIDINE HCL 150 MG PO TABS: 150.0000 mg | ORAL_TABLET | Freq: Two times a day (BID) | ORAL | 0 refills | Status: DC

## 2017-01-27 MED ORDER — METHYLPREDNISOLONE SODIUM SUCC 125 MG IJ SOLR
125.0000 mg | Freq: Once | INTRAMUSCULAR | Status: AC
  Administered 2017-01-27: 125 mg via INTRAVENOUS
  Filled 2017-01-27: qty 2

## 2017-01-27 MED ORDER — FAMOTIDINE IN NACL 20-0.9 MG/50ML-% IV SOLN
20.0000 mg | Freq: Once | INTRAVENOUS | Status: AC
  Administered 2017-01-27: 20 mg via INTRAVENOUS
  Filled 2017-01-27: qty 50

## 2017-01-27 MED ORDER — PREDNISONE 10 MG (21) PO TBPK
ORAL_TABLET | Freq: Every day | ORAL | 0 refills | Status: DC
Start: 2017-01-27 — End: 2021-04-17

## 2017-01-27 MED ORDER — SODIUM CHLORIDE 0.9 % IV BOLUS (SEPSIS)
500.0000 mL | Freq: Once | INTRAVENOUS | Status: AC
  Administered 2017-01-27: 500 mL via INTRAVENOUS

## 2017-01-27 MED ORDER — CYPROHEPTADINE HCL 4 MG PO TABS: 4.0000 mg | ORAL_TABLET | Freq: Three times a day (TID) | ORAL | 0 refills | Status: DC | PRN

## 2017-01-27 NOTE — Discharge Instructions (Signed)
You have had an allergic reaction to an unknown trigger. Continue to monitor your exposures. Drink plenty of fluids and follow-up with your provider or [facility]. Take the prescription meds as directed. Avoid excessive heat exposure or hot showers, until symptoms completely resolve.

## 2017-01-27 NOTE — ED Notes (Signed)
D&C IV 

## 2017-01-27 NOTE — ED Notes (Signed)
Iv started   meds given.  Pt states she has tickling of throat.

## 2017-01-27 NOTE — ED Triage Notes (Signed)
States itchy rash on neck and trunk, states it got worse after the tanning bed today

## 2017-01-27 NOTE — ED Notes (Signed)
Pt discharged to home.  Family member driving.  Discharge instructions reviewed.  Verbalized understanding.  No questions or concerns at this time.  Teach back verified.  Pt in NAD.  No items left in ED.   

## 2017-01-27 NOTE — ED Notes (Signed)
Pt reports itching rash that began yesterday after going to the tanning bed.  Pt went again today and itching rash got worse.  Rash on neck, ears, chest and back.  No resp distress.  Pt alert. Pt took no otc meds for itching

## 2017-01-29 NOTE — ED Provider Notes (Signed)
[facility] Emergency Department Provider Note ____________________________________________  Time seen: 1955  I have reviewed the triage vital signs and the nursing notes.  HISTORY  Chief Complaint  Allergic Reaction  HPI Donna Wallace is a 26 y.o. female presents to the ED for onset of an itchy rash to her neck, and trunk that has begun to spread to her torso or legs. Patient describes the onset began yesterday with a small spot to her neck. She describes the immediate spread of the rash today after she used a tanning bed. She presents now with some itching and irritation to the neck and throat. She denies any difficulty breathing, or swallowing. She is unaware of any known allergies, so triggers. She does report a previous episode of idiopathic or unknown hives following exposure to spicy seafood.She has not taken any medications in the interim for her symptom management.  Past Medical History:  Diagnosis Date  . Panic attacks   . Renal disorder     There are no active problems to display for this patient.   Past Surgical History:  Procedure Laterality Date  . CHOLECYSTECTOMY    . MYRINGOTOMY      Prior to Admission medications   Medication Sig Start Date End Date Taking? Authorizing Provider  benzonatate (TESSALON PERLES) 100 MG capsule Take 1 capsule (100 mg total) by mouth 3 (three) times daily as needed for cough (Take 1-2 per dose). 11/18/16   Jenise V Bacon Menshew, PA-C  cyproheptadine (PERIACTIN) 4 MG tablet Take 1 tablet (4 mg total) by mouth 3 (three) times daily as needed for allergies. 01/27/17   Jenise V Bacon Menshew, PA-C  diphenhydrAMINE (BENADRYL) 25 mg capsule Take 1 capsule (25 mg total) by mouth every 6 (six) hours as needed. 02/07/14   Sunnie NielsenBrian Opitz, MD  hydrOXYzine (ATARAX/VISTARIL) 50 MG tablet Take 1 tablet (50 mg total) by mouth 3 (three) times daily as needed. 06/19/15   Joni Reiningonald K Smith, PA-C  ibuprofen (ADVIL,MOTRIN) 600 MG tablet  Take 1 tablet (600 mg total) by mouth every 6 (six) hours as needed for pain. 03/26/13   Burgess AmorJulie Idol, PA-C  medroxyPROGESTERone (DEPO-PROVERA) 150 MG/ML injection Inject 150 mg into the muscle every 3 (three) months.    Historical Provider, MD  metroNIDAZOLE (FLAGYL) 500 MG tablet Take 500 mg by mouth 2 (two) times daily. 7 day course.    Historical Provider, MD  naproxen (NAPROSYN) 500 MG tablet Take 1 tablet (500 mg total) by mouth 2 (two) times daily with a meal. 11/15/15   Jene Everyobert Kinner, MD  ondansetron (ZOFRAN ODT) 4 MG disintegrating tablet Take 1 tablet (4 mg total) by mouth every 6 (six) hours as needed for nausea or vomiting. 11/18/16   Charlesetta IvoryJenise V Bacon Menshew, PA-C  predniSONE (STERAPRED UNI-PAK 21 TAB) 10 MG (21) TBPK tablet Take by mouth daily. 01/27/17   Jenise V Bacon Menshew, PA-C  ranitidine (ZANTAC) 150 MG tablet Take 1 tablet (150 mg total) by mouth 2 (two) times daily. 01/27/17   Jenise V Bacon Menshew, PA-C    Allergies Dilaudid [hydromorphone hcl] and Zithromax [azithromycin dihydrate]  History reviewed. No pertinent family history.  Social History Social History  Substance Use Topics  . Smoking status: Former Games developermoker  . Smokeless tobacco: Never Used  . Alcohol use Yes     Comment: weekends    Review of Systems  Constitutional: Negative for fever. Eyes: Negative for visual changes. ENT: Negative for sore throat. Cardiovascular: Negative for chest pain. Respiratory:  Negative for shortness of breath. Gastrointestinal: Negative for abdominal pain, vomiting and diarrhea. Musculoskeletal: Negative for back pain. Skin: Positive for rash. Neurological: Negative for headaches, focal weakness or numbness. ____________________________________________  PHYSICAL EXAM:  VITAL SIGNS: ED Triage Vitals [01/27/17 1859]  Enc Vitals Group     BP (!) 149/82     Pulse Rate 77     Resp 18     Temp 97.9 F (36.6 C)     Temp Source Oral     SpO2 100 %     Weight 158 lb (71.7  kg)     Height 5\' 3"  (1.6 m)     Head Circumference      Peak Flow      Pain Score      Pain Loc      Pain Edu?      Excl. in GC?     Constitutional: Alert and oriented. Well appearing and in no distress. Head: Normocephalic and atraumatic. Eyes: Conjunctivae are normal. PERRL. Normal extraocular movements Ears: Canals clear. TMs intact bilaterally. Nose: No congestion/rhinorrhea/epistaxis. Mouth/Throat: Mucous membranes are moist. Uvula is midline and tonsils are flat. No oropharyngeal lesions are appreciated. Neck: Supple. No thyromegaly. Hematological/Lymphatic/Immunological: No cervical lymphadenopathy. Cardiovascular: Normal rate, regular rhythm. Normal distal pulses. Respiratory: Normal respiratory effort. No wheezes/rales/rhonchi. Gastrointestinal: Soft and nontender. No distention. Musculoskeletal: Nontender with normal range of motion in all extremities.  Neurologic:  Normal gait without ataxia. Normal speech and language. No gross focal neurologic deficits are appreciated. Skin:  Skin is warm, dry and intact. Patient with multiple erythematous macular papular patches noted across the face, neck, trunk and extremities. She has scattered hives in these areas.  Psychiatric: Mood and affect are normal. Patient exhibits appropriate insight and judgment. 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 No known triggers normal at this time. Patient reports resolution of her symptoms after her ED course. She is advised to continue to monitor symptoms and return to the ED as needed. She is discharged with prescriptions for ranitidine, cyproheptadine, and a prednisone taper pack. He will follow with the primary care provider for  ongoing or intermittent ____________________________________________  FINAL CLINICAL IMPRESSION(S) / ED DIAGNOSES  Final diagnoses:  Allergic reaction, initial encounter  Hives      Lissa Hoard, PA-C 01/29/17 0007    Arnaldo Natal, MD 01/29/17 1440

## 2017-02-22 ENCOUNTER — Encounter: Payer: Self-pay | Admitting: Emergency Medicine

## 2017-02-22 ENCOUNTER — Emergency Department
Admission: EM | Admit: 2017-02-22 | Discharge: 2017-02-22 | Disposition: A | Discharge status: Home / Self Care | Payer: Self-pay | Attending: Emergency Medicine | Admitting: Emergency Medicine

## 2017-02-22 DIAGNOSIS — Z79899 Other long term (current) drug therapy: Secondary | ICD-10-CM | POA: Insufficient documentation

## 2017-02-22 DIAGNOSIS — Z87891 Personal history of nicotine dependence: Secondary | ICD-10-CM | POA: Insufficient documentation

## 2017-02-22 DIAGNOSIS — L509 Urticaria, unspecified: Secondary | ICD-10-CM

## 2017-02-22 DIAGNOSIS — Z91010 Allergy to peanuts: Secondary | ICD-10-CM | POA: Insufficient documentation

## 2017-02-22 DIAGNOSIS — L500 Allergic urticaria: Secondary | ICD-10-CM | POA: Insufficient documentation

## 2017-02-22 DIAGNOSIS — T7840XA Allergy, unspecified, initial encounter: Secondary | ICD-10-CM

## 2017-02-22 MED ORDER — PREDNISONE 10 MG PO TABS: 20.0000 mg | ORAL_TABLET | Freq: Every day | ORAL | 0 refills | Status: DC

## 2017-02-22 MED ORDER — DIPHENHYDRAMINE HCL 50 MG/ML IJ SOLN: 25.0000 mg | Freq: Once | INTRAMUSCULAR | Status: DC

## 2017-02-22 MED ORDER — DIPHENHYDRAMINE HCL 50 MG/ML IJ SOLN
INTRAMUSCULAR | Status: AC
  Administered 2017-02-22: 25 mg via INTRAVENOUS
  Filled 2017-02-22: qty 1

## 2017-02-22 MED ORDER — EPINEPHRINE 0.3 MG/0.3ML IJ SOAJ: 0.3000 mg | Freq: Once | INTRAMUSCULAR | 1 refills | Status: AC

## 2017-02-22 MED ORDER — RANITIDINE HCL 150 MG PO TABS: 150.0000 mg | ORAL_TABLET | Freq: Two times a day (BID) | ORAL | 1 refills | Status: DC

## 2017-02-22 MED ORDER — PREDNISONE 20 MG PO TABS
60.0000 mg | ORAL_TABLET | Freq: Once | ORAL | Status: AC
  Administered 2017-02-22: 60 mg via ORAL
  Filled 2017-02-22: qty 3

## 2017-02-22 MED ORDER — DIPHENHYDRAMINE HCL 50 MG/ML IJ SOLN
25.0000 mg | Freq: Once | INTRAMUSCULAR | Status: AC
  Administered 2017-02-22: 25 mg via INTRAVENOUS
  Filled 2017-02-22: qty 1

## 2017-02-22 MED ORDER — DIPHENHYDRAMINE HCL 50 MG/ML IJ SOLN
25.0000 mg | Freq: Once | INTRAMUSCULAR | Status: AC
  Administered 2017-02-22: 25 mg via INTRAVENOUS

## 2017-02-22 MED ORDER — METHYLPREDNISOLONE SODIUM SUCC 125 MG IJ SOLR
125.0000 mg | Freq: Once | INTRAMUSCULAR | Status: AC
  Administered 2017-02-22: 125 mg via INTRAVENOUS
  Filled 2017-02-22: qty 2

## 2017-02-22 MED ORDER — FAMOTIDINE IN NACL 20-0.9 MG/50ML-% IV SOLN
20.0000 mg | Freq: Once | INTRAVENOUS | Status: AC
  Administered 2017-02-22: 20 mg via INTRAVENOUS
  Filled 2017-02-22: qty 50

## 2017-02-22 NOTE — ED Notes (Signed)
Pt stating that she unknowingly had "tree nuts" in a crouton she ate. Pt has redness noted to bilateral arms and chest. Pt stating that she is also itchy. Pt's stating that her inside of her ears feel swollen along with some difficulty swallowing. Pt denying any respiratory distress. Pt placed on pulse O2

## 2017-02-22 NOTE — ED Triage Notes (Signed)
Pt presents via pov with allergic reaction to tree nuts after eating a salad. She reports itching, redness, and hives on trunk and neck. Pt states she feels like the inside of her ears is swelling. Denies difficulty breathing  "yet." She states that this reactions seems to be "moving faster" than other reactions in the past. NAD noted.

## 2017-02-22 NOTE — ED Notes (Signed)
Pt is in NAD at this time. Pt stating that she still feels like she is having difficulty swallowing and is itchy. Pt received an additional dose of Benadryl

## 2017-02-22 NOTE — Discharge Instructions (Signed)
Be careful to avoid any nuts of any kind. I would look at the back of most of things you buy make sure they are not processed in a facility that also processes any nuts. Keep Benadryl with you. I give you a prescription for EpiPen. If you begin to feel lightheaded or short of breath as the EpiPen and call 911.

## 2017-02-22 NOTE — ED Provider Notes (Signed)
[facility] Emergency Department Provider Note   ____________________________________________   First MD Initiated Contact with Patient 02/22/17 1508     (approximate)  I have reviewed the triage vital signs and the nursing notes.   HISTORY  Chief Complaint Allergic Reaction    HPI Donna Wallace is a 26 y.o. female she reports she had some croutons with tree nuts on them. She began getting itchy very quickly afterwards and felt like the inside of her ears were swelling and some trouble swallowing. She was not short of breath at all. She will give her some Benadryl Pepcid and site Medrol and she gradually improved over time. She has had repeated reactions to peanuts in the past.  Past Medical History:  Diagnosis Date  . Panic attacks   . Renal disorder     There are no active problems to display for this patient.   Past Surgical History:  Procedure Laterality Date  . CHOLECYSTECTOMY    . MYRINGOTOMY      Prior to Admission medications   Medication Sig Start Date End Date Taking? Authorizing Provider  diphenhydrAMINE (BENADRYL) 25 mg capsule Take 1 capsule (25 mg total) by mouth every 6 (six) hours as needed. 02/07/14  Yes Sunnie NielsenBrian Opitz, MD  benzonatate (TESSALON PERLES) 100 MG capsule Take 1 capsule (100 mg total) by mouth 3 (three) times daily as needed for cough (Take 1-2 per dose). Patient not taking: Reported on 02/22/2017 11/18/16   Charlesetta IvoryJenise V Bacon Menshew, PA-C  cyproheptadine (PERIACTIN) 4 MG tablet Take 1 tablet (4 mg total) by mouth 3 (three) times daily as needed for allergies. Patient not taking: Reported on 02/22/2017 01/27/17   Charlesetta IvoryJenise V Bacon Menshew, PA-C  EPINEPHrine (EPIPEN 2-PAK) 0.3 mg/0.3 mL IJ SOAJ injection Inject 0.3 mLs (0.3 mg total) into the muscle once. 02/22/17 02/22/17  Arnaldo NatalPaul F Malinda, MD  hydrOXYzine (ATARAX/VISTARIL) 50 MG tablet Take 1 tablet (50 mg total) by mouth 3 (three) times daily as needed. Patient not taking:  Reported on 02/22/2017 06/19/15   Joni Reiningonald K Smith, PA-C  ibuprofen (ADVIL,MOTRIN) 600 MG tablet Take 1 tablet (600 mg total) by mouth every 6 (six) hours as needed for pain. Patient not taking: Reported on 02/22/2017 03/26/13   Burgess AmorJulie Idol, PA-C  naproxen (NAPROSYN) 500 MG tablet Take 1 tablet (500 mg total) by mouth 2 (two) times daily with a meal. Patient not taking: Reported on 02/22/2017 11/15/15   Jene Everyobert Kinner, MD  ondansetron (ZOFRAN ODT) 4 MG disintegrating tablet Take 1 tablet (4 mg total) by mouth every 6 (six) hours as needed for nausea or vomiting. Patient not taking: Reported on 02/22/2017 11/18/16   Charlesetta IvoryJenise V Bacon Menshew, PA-C  predniSONE (DELTASONE) 10 MG tablet Take 2 tablets (20 mg total) by mouth daily. 02/22/17   Arnaldo NatalPaul F Malinda, MD  predniSONE (STERAPRED UNI-PAK 21 TAB) 10 MG (21) TBPK tablet Take by mouth daily. Patient not taking: Reported on 02/22/2017 01/27/17   Charlesetta IvoryJenise V Bacon Menshew, PA-C  ranitidine (ZANTAC) 150 MG tablet Take 1 tablet (150 mg total) by mouth 2 (two) times daily. Patient not taking: Reported on 02/22/2017 01/27/17   Charlesetta IvoryJenise V Bacon Menshew, PA-C  ranitidine (ZANTAC) 150 MG tablet Take 1 tablet (150 mg total) by mouth 2 (two) times daily. 02/22/17 02/22/18  Arnaldo NatalPaul F Malinda, MD    Allergies Peanut-containing drug products; Dilaudid [hydromorphone hcl]; and Zithromax [azithromycin dihydrate]  History reviewed. No pertinent family history.  Social History Social History  Substance Use  Topics  . Smoking status: Former Smoker    Types: Cigarettes    Quit date: 12/01/2014  . Smokeless tobacco: Never Used  . Alcohol use Yes     Comment: weekends    Review of Systems Constitutional: No fever/chills Eyes: No visual changes. ENT: No sore throat. Cardiovascular: Denies chest pain. Respiratory: Denies shortness of breath. Gastrointestinal: No abdominal pain.  No nausea, no vomiting.  No diarrhea.  No constipation. Genitourinary: Negative for  dysuria. Musculoskeletal: Negative for back pain. Skin: Negative for rash. Neurological: Negative for headaches, focal weakness or numbness.  10-point ROS otherwise negative.  ____________________________________________   PHYSICAL EXAM:  VITAL SIGNS: ED Triage Vitals [02/22/17 1451]  Enc Vitals Group     BP (!) 142/81     Pulse Rate 83     Resp 18     Temp 97.8 F (36.6 C)     Temp Source Oral     SpO2 99 %     Weight 158 lb (71.7 kg)     Height 5\' 3"  (1.6 m)     Head Circumference      Peak Flow      Pain Score      Pain Loc      Pain Edu?      Excl. in GC?    Constitutional: Alert and oriented. Well appearing and in no acute distress. Eyes: Conjunctivae are normal. PERRL. EOMI. Head: Atraumatic. Nose: No congestion/rhinnorhea. Mouth/Throat: Mucous membranes are moist.  Oropharynx non-erythematous. Neck: No stridor.   Cardiovascular: Normal rate, regular rhythm. Grossly normal heart sounds.  Good peripheral circulation. Respiratory: Normal respiratory effort.  No retractions. Lungs CTAB. Gastrointestinal: Soft and nontender. No distention. No abdominal bruits. No CVA tenderness. Musculoskeletal: No lower extremity tenderness nor edema.  No joint effusions. Neurologic:  Normal speech and language. No gross focal neurologic deficits are appreciated. No gait instability. Skin:  Skin is warm, dry and intact. No rash noted!!  ____________________________________________   LABS (all labs ordered are listed, but only abnormal results are displayed)  Labs Reviewed - No data to display ____________________________________________  EKG   ____________________________________________  RADIOLOGY   ____________________________________________   PROCEDURES  Procedure(s) performed:   Procedures  Critical Care performed:   ____________________________________________   INITIAL IMPRESSION / ASSESSMENT AND PLAN / ED COURSE  Pertinent labs & imaging results  that were available during my care of the patient were reviewed by me and considered in my medical decision making (see chart for details).  ----------------------------------------- 7:23 PM on 02/22/2017 -----------------------------------------  Patient is now no longer itchy and has been on it she for almost 2 hours. We'll begin to processor for discharge      ____________________________________________   FINAL CLINICAL IMPRESSION(S) / ED DIAGNOSES  Final diagnoses:  Allergic reaction, initial encounter  Hives      NEW MEDICATIONS STARTED DURING THIS VISIT:  New Prescriptions   EPINEPHRINE (EPIPEN 2-PAK) 0.3 MG/0.3 ML IJ SOAJ INJECTION    Inject 0.3 mLs (0.3 mg total) into the muscle once.   PREDNISONE (DELTASONE) 10 MG TABLET    Take 2 tablets (20 mg total) by mouth daily.   RANITIDINE (ZANTAC) 150 MG TABLET    Take 1 tablet (150 mg total) by mouth 2 (two) times daily.     Note:  This document was prepared using Dragon voice recognition software and may include unintentional dictation errors.    Arnaldo Natal, MD 02/22/17 [phone]

## 2017-02-22 NOTE — ED Notes (Signed)
Pt stating that she is noticing redness on her legs but is unsure if it was there before. Pt's redness to upper arms and chest have resolved at this time. Nurse informed Dr. Darnelle CatalanMalinda of pt's concern.

## 2017-02-22 NOTE — ED Notes (Signed)
Pt stating that she had a period of rash that came and went before nurse arrival to room. Nurse does not see any redness on pt's chest where in occurred. Pt stating "it's gone now." Dr. Darnelle CatalanMalinda was notified. Dr. Darnelle CatalanMalinda wants pt to attempt drinking water and see how she is handling swallowing. Pt given water at this time. Pt in NAD

## 2017-02-22 NOTE — ED Notes (Signed)
Pt stating that her itchiness has decreased at this time. Pt stating that she is "just feeling sleepy." Pt is unsure if there are any changes with her swallowing. Pt in NAD and VS WNL.

## 2017-02-22 NOTE — ED Notes (Signed)
Dr. Darnelle CatalanMalinda is at pt's bedside at this time re-evaluating pt.

## 2017-02-23 ENCOUNTER — Encounter: Payer: Self-pay | Admitting: *Deleted

## 2017-02-23 ENCOUNTER — Emergency Department
Admission: EM | Admit: 2017-02-23 | Discharge: 2017-02-23 | Disposition: A | Discharge status: Home / Self Care | Payer: Self-pay | Attending: Emergency Medicine | Admitting: Emergency Medicine

## 2017-02-23 DIAGNOSIS — T7840XA Allergy, unspecified, initial encounter: Secondary | ICD-10-CM | POA: Insufficient documentation

## 2017-02-23 DIAGNOSIS — Z87891 Personal history of nicotine dependence: Secondary | ICD-10-CM | POA: Insufficient documentation

## 2017-02-23 MED ORDER — FAMOTIDINE 20 MG PO TABS
20.0000 mg | ORAL_TABLET | Freq: Once | ORAL | Status: AC
  Administered 2017-02-23: 20 mg via ORAL
  Filled 2017-02-23: qty 1

## 2017-02-23 MED ORDER — DIPHENHYDRAMINE HCL 25 MG PO CAPS
50.0000 mg | ORAL_CAPSULE | Freq: Once | ORAL | Status: AC
  Administered 2017-02-23: 50 mg via ORAL
  Filled 2017-02-23: qty 2

## 2017-02-23 MED ORDER — RANITIDINE HCL 150 MG/10ML PO SYRP
150.0000 mg | ORAL_SOLUTION | Freq: Once | ORAL | Status: DC
  Filled 2017-02-23: qty 10

## 2017-02-23 MED ORDER — HYDROXYZINE HCL 25 MG PO TABS
50.0000 mg | ORAL_TABLET | Freq: Once | ORAL | Status: AC
  Administered 2017-02-23: 50 mg via ORAL
  Filled 2017-02-23: qty 2

## 2017-02-23 NOTE — ED Provider Notes (Signed)
[facility] Emergency Department Provider Note   ____________________________________________   I have reviewed the triage vital signs and the nursing notes.   HISTORY  Chief Complaint Allergic Reaction   History limited by: Not Limited   HPI Donna Wallace is a 26 y.o. female who presents to the emergency department today because of concerns for continued allergic reaction. She was seen in the emergency department yesterday after apparent reaction to tree nuts. In the emergency department she was given steroids Benadryl and Zantac. She stated she felt better when she went home however last night started having symptoms again. Took a Benadryl last night and slept. This morning after she was at work she started feeling some stomach upset as well as rash. She did not try any Benadryl or Zantac this morning. She did take prednisone today. Patient has not seen an allergy specialist. Denies any new or unusual ingestion today.    Past Medical History:  Diagnosis Date  . Panic attacks   . Renal disorder     There are no active problems to display for this patient.   Past Surgical History:  Procedure Laterality Date  . CHOLECYSTECTOMY    . MYRINGOTOMY      Prior to Admission medications   Medication Sig Start Date End Date Taking? Authorizing Provider  benzonatate (TESSALON PERLES) 100 MG capsule Take 1 capsule (100 mg total) by mouth 3 (three) times daily as needed for cough (Take 1-2 per dose). Patient not taking: Reported on 02/22/2017 11/18/16   Charlesetta Ivory Menshew, PA-C  cyproheptadine (PERIACTIN) 4 MG tablet Take 1 tablet (4 mg total) by mouth 3 (three) times daily as needed for allergies. Patient not taking: Reported on 02/22/2017 01/27/17   Charlesetta Ivory Menshew, PA-C  diphenhydrAMINE (BENADRYL) 25 mg capsule Take 1 capsule (25 mg total) by mouth every 6 (six) hours as needed. 02/07/14   Sunnie Nielsen, MD  hydrOXYzine (ATARAX/VISTARIL) 50 MG tablet  Take 1 tablet (50 mg total) by mouth 3 (three) times daily as needed. Patient not taking: Reported on 02/22/2017 06/19/15   Joni Reining, PA-C  ibuprofen (ADVIL,MOTRIN) 600 MG tablet Take 1 tablet (600 mg total) by mouth every 6 (six) hours as needed for pain. Patient not taking: Reported on 02/22/2017 03/26/13   Burgess Amor, PA-C  naproxen (NAPROSYN) 500 MG tablet Take 1 tablet (500 mg total) by mouth 2 (two) times daily with a meal. Patient not taking: Reported on 02/22/2017 11/15/15   Jene Every, MD  ondansetron (ZOFRAN ODT) 4 MG disintegrating tablet Take 1 tablet (4 mg total) by mouth every 6 (six) hours as needed for nausea or vomiting. Patient not taking: Reported on 02/22/2017 11/18/16   Charlesetta Ivory Menshew, PA-C  predniSONE (DELTASONE) 10 MG tablet Take 2 tablets (20 mg total) by mouth daily. 02/22/17   Arnaldo Natal, MD  predniSONE (STERAPRED UNI-PAK 21 TAB) 10 MG (21) TBPK tablet Take by mouth daily. Patient not taking: Reported on 02/22/2017 01/27/17   Charlesetta Ivory Menshew, PA-C  ranitidine (ZANTAC) 150 MG tablet Take 1 tablet (150 mg total) by mouth 2 (two) times daily. Patient not taking: Reported on 02/22/2017 01/27/17   Charlesetta Ivory Menshew, PA-C  ranitidine (ZANTAC) 150 MG tablet Take 1 tablet (150 mg total) by mouth 2 (two) times daily. 02/22/17 02/22/18  Arnaldo Natal, MD    Allergies Peanut-containing drug products; Dilaudid [hydromorphone hcl]; and Zithromax [azithromycin dihydrate]  History reviewed. No pertinent family history.  Social History Social History  Substance Use Topics  . Smoking status: Former Smoker    Types: Cigarettes    Quit date: 12/01/2014  . Smokeless tobacco: Never Used  . Alcohol use Yes     Comment: weekends    Review of Systems  Constitutional: Negative for fever. Cardiovascular: Negative for chest pain. Respiratory: Negative for shortness of breath. Gastrointestinal: Positive for abdominal upset.  Genitourinary: Negative for  dysuria. Musculoskeletal: Negative for back pain. Skin: Negative for rash. Neurological: Negative for headaches, focal weakness or numbness.  10-point ROS otherwise negative.  ____________________________________________   PHYSICAL EXAM:  VITAL SIGNS: ED Triage Vitals  Enc Vitals Group     BP 02/23/17 1218 (!) 154/91     Pulse Rate 02/23/17 1218 97     Resp 02/23/17 1218 16     Temp --      Temp src --      SpO2 02/23/17 1218 100 %     Weight 02/23/17 1211 158 lb (71.7 kg)     Height 02/23/17 1211 5\' 3"  (1.6 m)   Constitutional: Alert and oriented. Well appearing and in no distress. Eyes: Conjunctivae are normal. Normal extraocular movements. ENT   Head: Normocephalic and atraumatic.   Nose: No congestion/rhinnorhea.   Mouth/Throat: Mucous membranes are moist.   Neck: No stridor. Hematological/Lymphatic/Immunilogical: No cervical lymphadenopathy. Cardiovascular: Normal rate, regular rhythm.  No murmurs, rubs, or gallops. Respiratory: Normal respiratory effort without tachypnea nor retractions. Breath sounds are clear and equal bilaterally. No wheezes/rales/rhonchi. Gastrointestinal: Soft and non tender. No rebound. No guarding.  Genitourinary: Deferred Musculoskeletal: Normal range of motion in all extremities. No lower extremity edema. Neurologic:  Normal speech and language. No gross focal neurologic deficits are appreciated.  Skin:  Skin is warm, dry and intact. Some flushing noted to face. Psychiatric: Mood and affect are normal. Speech and behavior are normal. Patient exhibits appropriate insight and judgment.  ____________________________________________    LABS (pertinent positives/negatives)  None  ____________________________________________   EKG  None  ____________________________________________     RADIOLOGY  None  ____________________________________________   PROCEDURES  Procedures  ____________________________________________   INITIAL IMPRESSION / ASSESSMENT AND PLAN / ED COURSE  Pertinent labs & imaging results that were available during my care of the patient were reviewed by me and considered in my medical decision making (see chart for details).  Patient presented to the emergency department today because of concerns for continued rash and allergic type symptoms. Patient was given an Inderal here in the emergency department. She additionally was given some Atarax and did feel better. No shortness breath or respiratory complaints at time of discharge. Did discuss with patient's importance of taking Benadryl around the clock. Did discuss that she should continue to take the steroids and Zantac prescribed to her. She was given a prescription for EpiPen yesterday.  ____________________________________________   FINAL CLINICAL IMPRESSION(S) / ED DIAGNOSES  Final diagnoses:  Allergic reaction, initial encounter     Note: This dictation was prepared with Dragon dictation. Any transcriptional errors that result from this process are unintentional     Phineas SemenGraydon Goodman, MD 02/23/17 1605

## 2017-02-23 NOTE — Discharge Instructions (Signed)
Please seek medical attention for any high fevers, chest pain, shortness of breath, change in behavior, persistent vomiting, bloody stool or any other new or concerning symptoms.  

## 2017-02-23 NOTE — ED Triage Notes (Signed)
Pt states she was seen in Ed yesterday for allergic reACTION TO nuts, states she was given an epi pen for home, states continued itching and redness, states SOB and nausea, last benadryl was last night, awake and alert

## 2017-04-13 ENCOUNTER — Encounter: Payer: Self-pay | Admitting: Emergency Medicine

## 2017-04-13 ENCOUNTER — Emergency Department
Admission: EM | Admit: 2017-04-13 | Discharge: 2017-04-13 | Disposition: A | Discharge status: Home / Self Care | Payer: Self-pay | Attending: Emergency Medicine | Admitting: Emergency Medicine

## 2017-04-13 ENCOUNTER — Encounter: Payer: Self-pay | Admitting: Certified Nurse Midwife

## 2017-04-13 ENCOUNTER — Emergency Department: Payer: Self-pay

## 2017-04-13 DIAGNOSIS — Z87891 Personal history of nicotine dependence: Secondary | ICD-10-CM | POA: Insufficient documentation

## 2017-04-13 DIAGNOSIS — O209 Hemorrhage in early pregnancy, unspecified: Secondary | ICD-10-CM | POA: Insufficient documentation

## 2017-04-13 DIAGNOSIS — Z3A14 14 weeks gestation of pregnancy: Secondary | ICD-10-CM | POA: Insufficient documentation

## 2017-04-13 DIAGNOSIS — N939 Abnormal uterine and vaginal bleeding, unspecified: Secondary | ICD-10-CM

## 2017-04-13 LAB — HCG, QUANTITATIVE, PREGNANCY: hCG, Beta Chain, Quant, S: <1 m[IU]/mL (ref <5)

## 2017-04-13 LAB — ABO/RH: ABO/RH(D): AB POS

## 2017-04-13 NOTE — ED Provider Notes (Signed)
[facility] Emergency Department Provider Note  ____________________________________________  Time seen: Approximately 12:34 PM  I have reviewed the triage vital signs and the nursing notes.   HISTORY  Chief Complaint Vaginal Bleeding    HPI Donna Wallace is a 26 y.o. female who presents to the emergency department for evaluation of lower abdominal pain, cramping, and vaginal bleeding that started this morning at around 11:00. She states that she is approximately [redacted] weeks pregnant based on her last menstrual cycle in February. She states that she passed a "large clot." She continues to have abdominal cramping, but states that the vaginal bleeding has significantly decreased in amount and has not had to change pads since arrival to the ER. She is gravida 4, para 0, spontaneous abortions 3. She states that she had 2 positive pregnancy tests that were one week apart and has her first scheduled gynecology evaluation this afternoon at 3 PM.  Past Medical History:  Diagnosis Date  . Panic attacks   . Renal disorder     There are no active problems to display for this patient.   Past Surgical History:  Procedure Laterality Date  . CHOLECYSTECTOMY    . MYRINGOTOMY      Prior to Admission medications   Medication Sig Start Date End Date Taking? Authorizing Provider  benzonatate (TESSALON PERLES) 100 MG capsule Take 1 capsule (100 mg total) by mouth 3 (three) times daily as needed for cough (Take 1-2 per dose). Patient not taking: Reported on 02/22/2017 11/18/16   Menshew, Charlesetta IvoryJenise V Bacon, PA-C  cyproheptadine (PERIACTIN) 4 MG tablet Take 1 tablet (4 mg total) by mouth 3 (three) times daily as needed for allergies. Patient not taking: Reported on 02/22/2017 01/27/17   Menshew, Charlesetta IvoryJenise V Bacon, PA-C  diphenhydrAMINE (BENADRYL) 25 mg capsule Take 1 capsule (25 mg total) by mouth every 6 (six) hours as needed. 02/07/14   Sunnie Nielsenpitz, Brian, MD  hydrOXYzine (ATARAX/VISTARIL)  50 MG tablet Take 1 tablet (50 mg total) by mouth 3 (three) times daily as needed. Patient not taking: Reported on 02/22/2017 06/19/15   Joni ReiningSmith, Ronald K, PA-C  ibuprofen (ADVIL,MOTRIN) 600 MG tablet Take 1 tablet (600 mg total) by mouth every 6 (six) hours as needed for pain. Patient not taking: Reported on 02/22/2017 03/26/13   Burgess AmorIdol, Julie, PA-C  naproxen (NAPROSYN) 500 MG tablet Take 1 tablet (500 mg total) by mouth 2 (two) times daily with a meal. Patient not taking: Reported on 02/22/2017 11/15/15   Jene EveryKinner, Robert, MD  ondansetron (ZOFRAN ODT) 4 MG disintegrating tablet Take 1 tablet (4 mg total) by mouth every 6 (six) hours as needed for nausea or vomiting. Patient not taking: Reported on 02/22/2017 11/18/16   Menshew, Charlesetta IvoryJenise V Bacon, PA-C  predniSONE (DELTASONE) 10 MG tablet Take 2 tablets (20 mg total) by mouth daily. 02/22/17   Arnaldo NatalMalinda, Paul F, MD  predniSONE (STERAPRED UNI-PAK 21 TAB) 10 MG (21) TBPK tablet Take by mouth daily. Patient not taking: Reported on 02/22/2017 01/27/17   Menshew, Charlesetta IvoryJenise V Bacon, PA-C  ranitidine (ZANTAC) 150 MG tablet Take 1 tablet (150 mg total) by mouth 2 (two) times daily. Patient not taking: Reported on 02/22/2017 01/27/17   Menshew, Charlesetta IvoryJenise V Bacon, PA-C  ranitidine (ZANTAC) 150 MG tablet Take 1 tablet (150 mg total) by mouth 2 (two) times daily. 02/22/17 02/22/18  Arnaldo NatalMalinda, Paul F, MD    Allergies Peanut-containing drug products; Dilaudid [hydromorphone hcl]; and Zithromax [azithromycin dihydrate]  No family history on file.  Social History Social History  Substance Use Topics  . Smoking status: Former Smoker    Types: Cigarettes    Quit date: 12/01/2014  . Smokeless tobacco: Never Used  . Alcohol use Yes     Comment: weekends    Review of Systems Constitutional: Negative for fever. Respiratory: Negative for shortness of breath or cough. Gastrointestinal: Positive for abdominal pain; negative for nausea , negative for vomiting. Genitourinary: Negative for  dysuria , negative for vaginal discharge. Musculoskeletal: Negative for back pain. ____________________________________________   PHYSICAL EXAM:  VITAL SIGNS: ED Triage Vitals  Enc Vitals Group     BP 04/13/17 1138 (!) 139/97     Pulse Rate 04/13/17 1138 71     Resp 04/13/17 1138 18     Temp 04/13/17 1138 97.7 F (36.5 C)     Temp Source 04/13/17 1138 Oral     SpO2 04/13/17 1138 100 %     Weight 04/13/17 1139 168 lb (76.2 kg)     Height 04/13/17 1139 5\' 3"  (1.6 m)     Head Circumference --      Peak Flow --      Pain Score 04/13/17 1137 5     Pain Loc --      Pain Edu? --      Excl. in GC? --     Constitutional: Alert and oriented. Well appearing and in no acute distress. Eyes: Conjunctivae are normal. PERRL. EOMI. Head: Atraumatic. Nose: No congestion/rhinnorhea. Mouth/Throat: Mucous membranes are moist. Respiratory: Normal respiratory effort.  No retractions. Gastrointestinal: Soft and nontender Genitourinary: Pelvic exam: deferred Musculoskeletal: No extremity tenderness nor edema.  Neurologic:  Normal speech and language. No gross focal neurologic deficits are appreciated. Speech is normal. No gait instability. Skin:  Skin is warm, dry and intact. No rash noted. Psychiatric: Mood and affect are normal. Speech and behavior are normal.  ____________________________________________   LABS (all labs ordered are listed, but only abnormal results are displayed)  Labs Reviewed  HCG, QUANTITATIVE, PREGNANCY  ABO/RH   ____________________________________________  RADIOLOGY  Transabdominal and transvaginal ultrasound of pelvis is normal and does not show any sign of retained products of conception or other concerns.  ____________________________________________   PROCEDURES  Procedure(s) performed: None  ____________________________________________  26 year old female presenting to the emergency department for evaluation of lower abdominal pain and cramping  and passing a large clot earlier today in the setting of amenorrhea since February. She had taken 2 pregnancy tests 1 week apart which were both positive. According to ultrasound results, there is no sign of pregnancy today. Beta hCG is less than 1. Patient was encouraged to schedule an appointment with gynecology. She was also given strict return precautions for the emergency department.  INITIAL IMPRESSION / ASSESSMENT AND PLAN / ED COURSE  Pertinent labs & imaging results that were available during my care of the patient were reviewed by me and considered in my medical decision making (see chart for details).  ____________________________________________   FINAL CLINICAL IMPRESSION(S) / ED DIAGNOSES  Final diagnoses:  Episode of heavy vaginal bleeding    Note:  This document was prepared using Dragon voice recognition software and may include unintentional dictation errors.    Chinita Pester, FNP 04/13/17 1647    Jene Every, MD 04/15/17 1324

## 2017-04-13 NOTE — ED Notes (Signed)
See triage note states she is approx 14 weeks preg  And developed some bleeding this am

## 2017-04-13 NOTE — ED Triage Notes (Signed)
States [redacted] week pregnant with bleeding began today. Lower abdominal cramps.

## 2017-08-29 ENCOUNTER — Encounter: Payer: Self-pay | Admitting: Emergency Medicine

## 2017-08-29 ENCOUNTER — Emergency Department
Admission: EM | Admit: 2017-08-29 | Discharge: 2017-08-29 | Disposition: A | Discharge status: Home / Self Care | Payer: Self-pay | Attending: Emergency Medicine | Admitting: Emergency Medicine

## 2017-08-29 DIAGNOSIS — Z5321 Procedure and treatment not carried out due to patient leaving prior to being seen by health care provider: Secondary | ICD-10-CM | POA: Insufficient documentation

## 2017-08-29 NOTE — ED Triage Notes (Signed)
Pt ambulatory up to desk with c/o allergic reaction that has been going on for over an hour. Per pt, it's hard to swallow".

## 2017-08-29 NOTE — ED Triage Notes (Signed)
Patient states that she started having an allergic reaction about an hour ago. Patient states that she had a rash all over her chest but the rash has improved some. Patient states that she has some tingling to her lips but in no respiratory distress and able to speak in complete sentences.

## 2018-01-03 ENCOUNTER — Other Ambulatory Visit: Payer: Self-pay

## 2018-01-03 ENCOUNTER — Emergency Department
Admission: EM | Admit: 2018-01-03 | Discharge: 2018-01-03 | Disposition: A | Discharge status: Home / Self Care | Payer: Self-pay | Attending: Emergency Medicine | Admitting: Emergency Medicine

## 2018-01-03 DIAGNOSIS — Z79899 Other long term (current) drug therapy: Secondary | ICD-10-CM | POA: Insufficient documentation

## 2018-01-03 DIAGNOSIS — Z91010 Allergy to peanuts: Secondary | ICD-10-CM | POA: Insufficient documentation

## 2018-01-03 DIAGNOSIS — B370 Candidal stomatitis: Secondary | ICD-10-CM | POA: Insufficient documentation

## 2018-01-03 DIAGNOSIS — R0981 Nasal congestion: Secondary | ICD-10-CM | POA: Insufficient documentation

## 2018-01-03 DIAGNOSIS — R69 Illness, unspecified: Secondary | ICD-10-CM

## 2018-01-03 DIAGNOSIS — J111 Influenza due to unidentified influenza virus with other respiratory manifestations: Secondary | ICD-10-CM

## 2018-01-03 DIAGNOSIS — Z87891 Personal history of nicotine dependence: Secondary | ICD-10-CM | POA: Insufficient documentation

## 2018-01-03 DIAGNOSIS — R509 Fever, unspecified: Secondary | ICD-10-CM | POA: Insufficient documentation

## 2018-01-03 MED ORDER — FLUTICASONE PROPIONATE 50 MCG/ACT NA SUSP: 1.0000 | Freq: Two times a day (BID) | NASAL | 0 refills | Status: DC

## 2018-01-03 MED ORDER — MAGIC MOUTHWASH W/LIDOCAINE: 5.0000 mL | Freq: Four times a day (QID) | ORAL | 0 refills | Status: DC

## 2018-01-03 MED ORDER — NYSTATIN 100000 UNIT/ML MT SUSP: 2.0000 mL | Freq: Four times a day (QID) | OROMUCOSAL | 0 refills | Status: DC

## 2018-01-03 MED ORDER — PSEUDOEPH-BROMPHEN-DM 30-2-10 MG/5ML PO SYRP: 10.0000 mL | ORAL_SOLUTION | Freq: Four times a day (QID) | ORAL | 0 refills | Status: DC | PRN

## 2018-01-03 NOTE — ED Triage Notes (Signed)
Pt states Thursday had low grade fever. C/o sore throat, nasal congestion. C/o body aches. Denies N&V&D. Has taken alka seltzer at home.

## 2018-01-03 NOTE — ED Provider Notes (Signed)
[facility] Emergency Department Provider Note  ____________________________________________  Time seen: Approximately 7:32 PM  I have reviewed the triage vital signs and the nursing notes.   HISTORY  Chief Complaint Fever and Nasal Congestion    HPI Donna Wallace is a 27 y.o. female who presents the emergency department complaining of nasal congestion, sore throat, cough, low-grade fevers times 4 days.  Symptoms began insidiously.  Patient reports that initial symptoms were nasal congestion, sore throat, low-grade fevers.  Patient began to cough today.  No difficulty breathing or shortness of breath.  No headache, visual changes, neck stiffness, abdominal pain, nausea or vomiting.  Patient has been taking Alka-Seltzer for this complaint with minimal relief.  No other complaints at this time.  No other medications prior to arrival.  Past Medical History:  Diagnosis Date  . Panic attacks   . Renal disorder     There are no active problems to display for this patient.   Past Surgical History:  Procedure Laterality Date  . CHOLECYSTECTOMY    . MYRINGOTOMY      Prior to Admission medications   Medication Sig Start Date End Date Taking? Authorizing Provider  benzonatate (TESSALON PERLES) 100 MG capsule Take 1 capsule (100 mg total) by mouth 3 (three) times daily as needed for cough (Take 1-2 per dose). Patient not taking: Reported on 02/22/2017 11/18/16   Menshew, Charlesetta Ivory, PA-C  brompheniramine-pseudoephedrine-DM 30-2-10 MG/5ML syrup Take 10 mLs by mouth 4 (four) times daily as needed. 01/03/18   Cuthriell, Delorise Royals, PA-C  cyproheptadine (PERIACTIN) 4 MG tablet Take 1 tablet (4 mg total) by mouth 3 (three) times daily as needed for allergies. Patient not taking: Reported on 02/22/2017 01/27/17   Menshew, Charlesetta Ivory, PA-C  diphenhydrAMINE (BENADRYL) 25 mg capsule Take 1 capsule (25 mg total) by mouth every 6 (six) hours as needed. 02/07/14   Sunnie Nielsen, MD  fluticasone (FLONASE) 50 MCG/ACT nasal spray Place 1 spray into both nostrils 2 (two) times daily. 01/03/18   Cuthriell, Delorise Royals, PA-C  hydrOXYzine (ATARAX/VISTARIL) 50 MG tablet Take 1 tablet (50 mg total) by mouth 3 (three) times daily as needed. Patient not taking: Reported on 02/22/2017 06/19/15   Joni Reining, PA-C  ibuprofen (ADVIL,MOTRIN) 600 MG tablet Take 1 tablet (600 mg total) by mouth every 6 (six) hours as needed for pain. Patient not taking: Reported on 02/22/2017 03/26/13   Burgess Amor, PA-C  magic mouthwash w/lidocaine SOLN Take 5 mLs by mouth 4 (four) times daily. 01/03/18   Cuthriell, Delorise Royals, PA-C  naproxen (NAPROSYN) 500 MG tablet Take 1 tablet (500 mg total) by mouth 2 (two) times daily with a meal. Patient not taking: Reported on 02/22/2017 11/15/15   Jene Every, MD  nystatin (MYCOSTATIN) 100000 UNIT/ML suspension Take 2 mLs (200,000 Units total) by mouth 4 (four) times daily. 1ml to each cheek QID x 7 days 01/03/18   Cuthriell, Delorise Royals, PA-C  ondansetron (ZOFRAN ODT) 4 MG disintegrating tablet Take 1 tablet (4 mg total) by mouth every 6 (six) hours as needed for nausea or vomiting. Patient not taking: Reported on 02/22/2017 11/18/16   Menshew, Charlesetta Ivory, PA-C  predniSONE (DELTASONE) 10 MG tablet Take 2 tablets (20 mg total) by mouth daily. 02/22/17   Arnaldo Natal, MD  predniSONE (STERAPRED UNI-PAK 21 TAB) 10 MG (21) TBPK tablet Take by mouth daily. Patient not taking: Reported on 02/22/2017 01/27/17   Menshew, Charlesetta Ivory, PA-C  ranitidine (ZANTAC) 150 MG tablet Take 1 tablet (150 mg total) by mouth 2 (two) times daily. Patient not taking: Reported on 02/22/2017 01/27/17   Menshew, Charlesetta IvoryJenise V Bacon, PA-C  ranitidine (ZANTAC) 150 MG tablet Take 1 tablet (150 mg total) by mouth 2 (two) times daily. 02/22/17 02/22/18  Arnaldo NatalMalinda, Paul F, MD    Allergies Peanut-containing drug products; Dilaudid [hydromorphone hcl]; Flounder [fish allergy]; and Zithromax  [azithromycin dihydrate]  History reviewed. No pertinent family history.  Social History Social History   Tobacco Use  . Smoking status: Former Smoker    Types: Cigarettes    Last attempt to quit: 12/01/2014    Years since quitting: 3.0  . Smokeless tobacco: Never Used  Substance Use Topics  . Alcohol use: Yes    Comment: weekends  . Drug use: No     Review of Systems  Constitutional: Low-grade fever/chills Eyes: No visual changes. No discharge ENT: Positive for nasal congestion and sore throat. Cardiovascular: no chest pain. Respiratory: Positive cough. No SOB. Gastrointestinal: No abdominal pain.  No nausea, no vomiting.  No diarrhea.  No constipation. Musculoskeletal: Negative for musculoskeletal pain. Skin: Negative for rash, abrasions, lacerations, ecchymosis. Neurological: Negative for headaches, focal weakness or numbness. 10-point ROS otherwise negative.  ____________________________________________   PHYSICAL EXAM:  VITAL SIGNS: ED Triage Vitals [01/03/18 1814]  Enc Vitals Group     BP (!) 138/93     Pulse Rate (!) 109     Resp 18     Temp 98.3 F (36.8 C)     Temp Source Oral     SpO2 100 %     Weight 150 lb (68 kg)     Height 5\' 3"  (1.6 m)     Head Circumference      Peak Flow      Pain Score 0     Pain Loc      Pain Edu?      Excl. in GC?      Constitutional: Alert and oriented. Well appearing and in no acute distress. Eyes: Conjunctivae are normal. PERRL. EOMI. Head: Atraumatic. ENT:      Ears: EACs and TMs unremarkable bilaterally      Nose: Moderate clear congestion/rhinnorhea.      Mouth/Throat: Mucous membranes are moist.  Oropharynx is mildly erythematous but nonedematous.  Tonsils are mildly erythematous but nonedematous and no exudates.  Uvula is midline.  Patient does have white plaque on the posterior tongue consistent with thrush. Neck: No stridor.  Neck is supple full range of motion Hematological/Lymphatic/Immunilogical:  Scattered, nontender anterior cervical lymphadenopathy. Cardiovascular: Normal rate, regular rhythm. Normal S1 and S2.  Good peripheral circulation. Respiratory: Normal respiratory effort without tachypnea or retractions. Lungs CTAB. Good air entry to the bases with no decreased or absent breath sounds. Musculoskeletal: Full range of motion to all extremities. No gross deformities appreciated. Neurologic:  Normal speech and language. No gross focal neurologic deficits are appreciated.  Skin:  Skin is warm, dry and intact. No rash noted. Psychiatric: Mood and affect are normal. Speech and behavior are normal. Patient exhibits appropriate insight and judgement.   ____________________________________________   LABS (all labs ordered are listed, but only abnormal results are displayed)  Labs Reviewed - No data to display ____________________________________________  EKG   ____________________________________________  RADIOLOGY   No results found.  ____________________________________________    PROCEDURES  Procedure(s) performed:    Procedures    Medications - No data to display   ____________________________________________   INITIAL IMPRESSION /  ASSESSMENT AND PLAN / ED COURSE  Pertinent labs & imaging results that were available during my care of the patient were reviewed by me and considered in my medical decision making (see chart for details).  Review of the [place] CSRS was performed in accordance of the [facility] prior to dispensing any controlled drugs.     Patient's diagnosis is consistent with influenza-like illness and thrush.  Patient presented to the emergency department with nasal congestion, sore throat, fevers, coughing.  At this time, patient has had symptoms times 4 days.  Exam was reassuring.  No indication for labs or imaging at this time.  Patient is outside treatment window for influenza, however I feel that symptoms are more likely viral upper  respiratory infection versus influenza.. Patient will be discharged home with prescriptions for Flonase, Magic mouthwash, Bromfed cough syrup, nystatin. Patient is to follow up with primary care as needed or otherwise directed. Patient is given ED precautions to return to the ED for any worsening or new symptoms.     ____________________________________________  FINAL CLINICAL IMPRESSION(S) / ED DIAGNOSES  Final diagnoses:  Influenza-like illness  Oral thrush      NEW MEDICATIONS STARTED DURING THIS VISIT:  ED Discharge Orders        Ordered    fluticasone (FLONASE) 50 MCG/ACT nasal spray  2 times daily     01/03/18 1944    magic mouthwash w/lidocaine SOLN  4 times daily    Comments:  Dispense in a 1/1/1/1 ratio. Use lidocaine, diphenhydramine, prednisolone, nystatin   01/03/18 1944    brompheniramine-pseudoephedrine-DM 30-2-10 MG/5ML syrup  4 times daily PRN     01/03/18 1944    nystatin (MYCOSTATIN) 100000 UNIT/ML suspension  4 times daily     01/03/18 1944          This chart was dictated using voice recognition software/Dragon. Despite best efforts to proofread, errors can occur which can change the meaning. Any change was purely unintentional.    Racheal Patches, PA-C 01/03/18 1945    Myrna Blazer, MD 01/03/18 [phone]

## 2018-10-04 LAB — HM PAP SMEAR: HM Pap smear: NEGATIVE

## 2018-10-04 LAB — HM HIV SCREENING LAB: HM HIV Screening: NEGATIVE

## 2018-11-14 ENCOUNTER — Emergency Department: Payer: Self-pay

## 2018-11-14 ENCOUNTER — Other Ambulatory Visit: Payer: Self-pay

## 2018-11-14 ENCOUNTER — Emergency Department
Admission: EM | Admit: 2018-11-14 | Discharge: 2018-11-14 | Disposition: A | Discharge status: Home / Self Care | Payer: Self-pay | Attending: Emergency Medicine | Admitting: Emergency Medicine

## 2018-11-14 DIAGNOSIS — M549 Dorsalgia, unspecified: Secondary | ICD-10-CM | POA: Insufficient documentation

## 2018-11-14 DIAGNOSIS — Z91010 Allergy to peanuts: Secondary | ICD-10-CM | POA: Insufficient documentation

## 2018-11-14 DIAGNOSIS — M7918 Myalgia, other site: Secondary | ICD-10-CM

## 2018-11-14 DIAGNOSIS — Z87891 Personal history of nicotine dependence: Secondary | ICD-10-CM | POA: Insufficient documentation

## 2018-11-14 DIAGNOSIS — Z79899 Other long term (current) drug therapy: Secondary | ICD-10-CM | POA: Insufficient documentation

## 2018-11-14 LAB — FIBRIN DERIVATIVES D-DIMER (ARMC ONLY): Fibrin derivatives D-dimer (ARMC): 206.9 ng/mL (FEU) (ref 0.00–499.00)

## 2018-11-14 MED ORDER — METHOCARBAMOL 500 MG PO TABS: 500.0000 mg | ORAL_TABLET | Freq: Three times a day (TID) | ORAL | 0 refills | Status: AC | PRN

## 2018-11-14 MED ORDER — MELOXICAM 15 MG PO TABS: 15.0000 mg | ORAL_TABLET | Freq: Every day | ORAL | 1 refills | Status: AC

## 2018-11-14 NOTE — ED Notes (Signed)
See triage note. Pt states back/rib cage page 6/10 when she takes a deep breath. Pt calm.

## 2018-11-14 NOTE — ED Triage Notes (Signed)
Pt reports that in August she had "fluid in her lungs" - she started with pain in right mid back pain that is radiating to the front rib cage - she reports that this is the same type of pain - denies cough or any symptoms of illness

## 2018-11-14 NOTE — ED Provider Notes (Signed)
[facility] Emergency Department Provider Note  ____________________________________________  Time seen: Approximately 6:22 PM  I have reviewed the triage vital signs and the nursing notes.   HISTORY  Chief Complaint Back Pain    HPI Donna Wallace is a 27 y.o. female with a history of anxiety, presents to the emergency department with shortness of breath for the past 2 to 3 days and right sided upper back pain that is noticeable when patient takes a deep breath or when she "moves quickly".  Patient reports that pain is not reproducible to the touch and she has no recent history of heavy lifting.  Patient recently changed her birth control 2 weeks ago.  She denies recent weight loss or weight gain.  No history of smoking, recent travel or prolonged immobility.  Patient states that she has had a pleural effusion in the past but denies history of PE or DVT.  Patient denies rhinorrhea, congestion or nonproductive cough.  No alleviating measures have been attempted.   Past Medical History:  Diagnosis Date  . Panic attacks   . Renal disorder     There are no active problems to display for this patient.   Past Surgical History:  Procedure Laterality Date  . CHOLECYSTECTOMY    . MYRINGOTOMY      Prior to Admission medications   Medication Sig Start Date End Date Taking? Authorizing Provider  benzonatate (TESSALON PERLES) 100 MG capsule Take 1 capsule (100 mg total) by mouth 3 (three) times daily as needed for cough (Take 1-2 per dose). Patient not taking: Reported on 02/22/2017 11/18/16   Menshew, Charlesetta Ivory, PA-C  brompheniramine-pseudoephedrine-DM 30-2-10 MG/5ML syrup Take 10 mLs by mouth 4 (four) times daily as needed. 01/03/18   Cuthriell, Delorise Royals, PA-C  cyproheptadine (PERIACTIN) 4 MG tablet Take 1 tablet (4 mg total) by mouth 3 (three) times daily as needed for allergies. Patient not taking: Reported on 02/22/2017 01/27/17   Menshew, Charlesetta Ivory,  PA-C  diphenhydrAMINE (BENADRYL) 25 mg capsule Take 1 capsule (25 mg total) by mouth every 6 (six) hours as needed. 02/07/14   Sunnie Nielsen, MD  fluticasone (FLONASE) 50 MCG/ACT nasal spray Place 1 spray into both nostrils 2 (two) times daily. 01/03/18   Cuthriell, Delorise Royals, PA-C  hydrOXYzine (ATARAX/VISTARIL) 50 MG tablet Take 1 tablet (50 mg total) by mouth 3 (three) times daily as needed. Patient not taking: Reported on 02/22/2017 06/19/15   Joni Reining, PA-C  ibuprofen (ADVIL,MOTRIN) 600 MG tablet Take 1 tablet (600 mg total) by mouth every 6 (six) hours as needed for pain. Patient not taking: Reported on 02/22/2017 03/26/13   Burgess Amor, PA-C  magic mouthwash w/lidocaine SOLN Take 5 mLs by mouth 4 (four) times daily. 01/03/18   Cuthriell, Delorise Royals, PA-C  meloxicam (MOBIC) 15 MG tablet Take 1 tablet (15 mg total) by mouth daily for 7 days. 11/14/18 11/21/18  Orvil Feil, PA-C  methocarbamol (ROBAXIN) 500 MG tablet Take 1 tablet (500 mg total) by mouth 3 (three) times daily as needed for up to 5 days. 11/14/18 11/19/18  Orvil Feil, PA-C  naproxen (NAPROSYN) 500 MG tablet Take 1 tablet (500 mg total) by mouth 2 (two) times daily with a meal. Patient not taking: Reported on 02/22/2017 11/15/15   Jene Every, MD  nystatin (MYCOSTATIN) 100000 UNIT/ML suspension Take 2 mLs (200,000 Units total) by mouth 4 (four) times daily. 1ml to each cheek QID x 7 days 01/03/18   Cuthriell,  Delorise RoyalsJonathan D, PA-C  ondansetron (ZOFRAN ODT) 4 MG disintegrating tablet Take 1 tablet (4 mg total) by mouth every 6 (six) hours as needed for nausea or vomiting. Patient not taking: Reported on 02/22/2017 11/18/16   Menshew, Charlesetta IvoryJenise V Bacon, PA-C  predniSONE (DELTASONE) 10 MG tablet Take 2 tablets (20 mg total) by mouth daily. 02/22/17   Arnaldo NatalMalinda, Paul F, MD  predniSONE (STERAPRED UNI-PAK 21 TAB) 10 MG (21) TBPK tablet Take by mouth daily. Patient not taking: Reported on 02/22/2017 01/27/17   Menshew, Charlesetta IvoryJenise V Bacon, PA-C   ranitidine (ZANTAC) 150 MG tablet Take 1 tablet (150 mg total) by mouth 2 (two) times daily. Patient not taking: Reported on 02/22/2017 01/27/17   Menshew, Charlesetta IvoryJenise V Bacon, PA-C  ranitidine (ZANTAC) 150 MG tablet Take 1 tablet (150 mg total) by mouth 2 (two) times daily. 02/22/17 02/22/18  Arnaldo NatalMalinda, Paul F, MD    Allergies Peanut-containing drug products; Dilaudid [hydromorphone hcl]; Flounder [fish allergy]; Lidocaine; and Zithromax [azithromycin dihydrate]  No family history on file.  Social History Social History   Tobacco Use  . Smoking status: Former Smoker    Types: Cigarettes    Last attempt to quit: 12/01/2014    Years since quitting: 3.9  . Smokeless tobacco: Never Used  Substance Use Topics  . Alcohol use: Yes    Comment: weekends  . Drug use: No     Review of Systems  Constitutional: No fever/chills Eyes: No visual changes. No discharge ENT: No upper respiratory complaints. Cardiovascular: no chest pain. Respiratory: Patient has shortness of breath. Gastrointestinal: No abdominal pain.  No nausea, no vomiting.  No diarrhea.  No constipation. Genitourinary: Negative for dysuria. No hematuria Musculoskeletal: Patient has right-sided upper back pain. Skin: Negative for rash, abrasions, lacerations, ecchymosis. Neurological: Negative for headaches, focal weakness or numbness.   ____________________________________________   PHYSICAL EXAM:  VITAL SIGNS: ED Triage Vitals [11/14/18 1612]  Enc Vitals Group     BP 132/77     Pulse Rate 80     Resp 16     Temp 98.3 F (36.8 C)     Temp Source Oral     SpO2 99 %     Weight 161 lb (73 kg)     Height 5\' 3"  (1.6 m)     Head Circumference      Peak Flow      Pain Score 6     Pain Loc      Pain Edu?      Excl. in GC?      Constitutional: Alert and oriented. Well appearing and in no acute distress. Eyes: Conjunctivae are normal. PERRL. EOMI. Head: Atraumatic. ENT:      Ears: TMs are pearly      Nose: No  congestion/rhinnorhea.      Mouth/Throat: Mucous membranes are moist.  Neck: No stridor.  No cervical spine tenderness to palpation. Hematological/Lymphatic/Immunilogical: No cervical lymphadenopathy. Cardiovascular: Normal rate, regular rhythm. Normal S1 and S2.  Good peripheral circulation. Respiratory: Normal respiratory effort without tachypnea or retractions. Lungs CTAB. Good air entry to the bases with no decreased or absent breath sounds. Gastrointestinal: Bowel sounds 4 quadrants. Soft and nontender to palpation. No guarding or rigidity. No palpable masses. No distention. No CVA tenderness. Musculoskeletal: Full range of motion to all extremities. No gross deformities appreciated.  No midline thoracic pain.  No paraspinal muscle tenderness to palpation along the thoracic spine. Neurologic:  Normal speech and language. No gross focal neurologic deficits are appreciated.  Skin:  Skin is warm, dry and intact. No rash noted. Psychiatric: Mood and affect are normal. Speech and behavior are normal. Patient exhibits appropriate insight and judgement.   ____________________________________________   LABS (all labs ordered are listed, but only abnormal results are displayed)  Labs Reviewed  FIBRIN DERIVATIVES D-DIMER ([facility] ONLY)  POC URINE PREG, ED   ____________________________________________  EKG   ____________________________________________  RADIOLOGY I personally viewed and evaluated these images as part of my medical decision making, as well as reviewing the written report by the radiologist.* Dg Chest 2 View  Result Date: 11/14/2018 CLINICAL DATA:  Back pain EXAM: CHEST - 2 VIEW COMPARISON:  03/12/2009 FINDINGS: Heart and mediastinal contours are within normal limits. No focal opacities or effusions. No acute bony abnormality. IMPRESSION: No active cardiopulmonary disease. Electronically Signed   By: Charlett Nose M.D.   On: 11/14/2018 17:14   Dg Thoracic Spine 2  View  Result Date: 11/14/2018 CLINICAL DATA:  Upper back pain radiating to right side. EXAM: THORACIC SPINE 2 VIEWS COMPARISON:  Chest x-ray earlier today. FINDINGS: There is no evidence of thoracic spine fracture. Alignment is normal. No other significant bone abnormalities are identified. IMPRESSION: Negative. Electronically Signed   By: Charlett Nose M.D.   On: 11/14/2018 18:40    ____________________________________________    PROCEDURES  Procedure(s) performed:    Procedures    Medications - No data to display   ____________________________________________   INITIAL IMPRESSION / ASSESSMENT AND PLAN / ED COURSE  Pertinent labs & imaging results that were available during my care of the patient were reviewed by me and considered in my medical decision making (see chart for details).  Review of the [place] CSRS was performed in accordance of the [facility] prior to dispensing any controlled drugs.      ----------------------------------------- 6:25 PM on 11/14/2018 -----------------------------------------  Patient has shortness of breath and pleuritic chest pain with right-sided upper back pain.  Concern for PE given recent change in contraceptive.  Will obtain a d-dimer.  If d-dimer is elevated plan is to obtain CTA.  Will get thoracic spine x-ray. Chest x-ray reveals no evidence of pleural effusion.   Assessment and Plan:  Musculoskeletal pain Differential diagnosis included PE, pleural effusion, community-acquired pneumonia, anxiety and muscle strain Patient presented to the emergency department with shortness of breath over the past 2 to 3 days and right upper back pain.  On physical exam, pain cannot be reproduced with palpation.  Patient recently had a change in birth control which was her only risk factor for PE.  D-dimer was within reference range.  Chest x-ray revealed no evidence of pleural effusion or community-acquired pneumonia.  No fracture was identified on x-ray  examination of the thoracic spine.  Patient did convey on follow-up physical exam that she does experience shortness of breath when she is anxious.  Patient was discharged with meloxicam and Robaxin for likely musculoskeletal pain.  Patient was advised to follow-up with primary care as needed.    ____________________________________________  FINAL CLINICAL IMPRESSION(S) / ED DIAGNOSES  Final diagnoses:  Musculoskeletal pain      NEW MEDICATIONS STARTED DURING THIS VISIT:  ED Discharge Orders         Ordered    methocarbamol (ROBAXIN) 500 MG tablet  3 times daily PRN     11/14/18 1945    meloxicam (MOBIC) 15 MG tablet  Daily     11/14/18 1945  This chart was dictated using voice recognition software/Dragon. Despite best efforts to proofread, errors can occur which can change the meaning. Any change was purely unintentional.    Gasper Lloyd 11/14/18 Domenica Reamer, MD 11/14/18 2016

## 2019-01-20 ENCOUNTER — Other Ambulatory Visit: Payer: Self-pay

## 2019-01-20 ENCOUNTER — Encounter: Payer: Self-pay | Admitting: *Deleted

## 2019-01-20 ENCOUNTER — Emergency Department
Admission: EM | Admit: 2019-01-20 | Discharge: 2019-01-20 | Disposition: A | Discharge status: Home / Self Care | Payer: Self-pay | Attending: Emergency Medicine | Admitting: Emergency Medicine

## 2019-01-20 DIAGNOSIS — B9789 Other viral agents as the cause of diseases classified elsewhere: Secondary | ICD-10-CM | POA: Insufficient documentation

## 2019-01-20 DIAGNOSIS — J028 Acute pharyngitis due to other specified organisms: Secondary | ICD-10-CM

## 2019-01-20 DIAGNOSIS — Z87891 Personal history of nicotine dependence: Secondary | ICD-10-CM | POA: Insufficient documentation

## 2019-01-20 DIAGNOSIS — J069 Acute upper respiratory infection, unspecified: Secondary | ICD-10-CM

## 2019-01-20 DIAGNOSIS — Z79899 Other long term (current) drug therapy: Secondary | ICD-10-CM | POA: Insufficient documentation

## 2019-01-20 DIAGNOSIS — Z91010 Allergy to peanuts: Secondary | ICD-10-CM | POA: Insufficient documentation

## 2019-01-20 LAB — INFLUENZA PANEL BY PCR (TYPE A & B)
Influenza A By PCR: NEGATIVE
Influenza B By PCR: NEGATIVE

## 2019-01-20 LAB — GROUP A STREP BY PCR: Group A Strep by PCR: NOT DETECTED

## 2019-01-20 MED ORDER — PSEUDOEPH-BROMPHEN-DM 30-2-10 MG/5ML PO SYRP: 5.0000 mL | ORAL_SOLUTION | Freq: Four times a day (QID) | ORAL | 0 refills | Status: DC | PRN

## 2019-01-20 MED ORDER — ACETAMINOPHEN 325 MG PO TABS
650.0000 mg | ORAL_TABLET | Freq: Once | ORAL | Status: AC
  Administered 2019-01-20: 650 mg via ORAL
  Filled 2019-01-20: qty 2

## 2019-01-20 MED ORDER — IBUPROFEN 600 MG PO TABS: 600.0000 mg | ORAL_TABLET | Freq: Three times a day (TID) | ORAL | 0 refills | Status: DC | PRN

## 2019-01-20 NOTE — ED Triage Notes (Signed)
Pt states fever today, sore throat, congestion for the past few days, NAD.

## 2019-01-20 NOTE — ED Triage Notes (Signed)
Right ear pain as well.

## 2019-01-20 NOTE — ED Provider Notes (Signed)
[facility] Emergency Department Provider Note   ____________________________________________   First MD Initiated Contact with Patient 01/20/19 1413     (approximate)  I have reviewed the triage vital signs and the nursing notes.   HISTORY  Chief Complaint Fever; Sore Throat; Otalgia; and Nasal Congestion    HPI Donna Wallace is a 28 y.o. female patient presents with onset of fever today with sore throat.  Patient states nasal congestion ear pressure for a few days.  Patient denies nausea, vomiting, diarrhea.  Patient has taken flu shot for this season.  Patient rates the pain as a 7/10.  Patient described the pain as "achy/sore".  No palliative measure for complaint.    Past Medical History:  Diagnosis Date  . Panic attacks   . Renal disorder     There are no active problems to display for this patient.   Past Surgical History:  Procedure Laterality Date  . CHOLECYSTECTOMY    . MYRINGOTOMY      Prior to Admission medications   Medication Sig Start Date End Date Taking? Authorizing Provider  benzonatate (TESSALON PERLES) 100 MG capsule Take 1 capsule (100 mg total) by mouth 3 (three) times daily as needed for cough (Take 1-2 per dose). Patient not taking: Reported on 02/22/2017 11/18/16   Menshew, Charlesetta IvoryJenise V Bacon, PA-C  brompheniramine-pseudoephedrine-DM 30-2-10 MG/5ML syrup Take 10 mLs by mouth 4 (four) times daily as needed. 01/03/18   Cuthriell, Delorise RoyalsJonathan D, PA-C  brompheniramine-pseudoephedrine-DM 30-2-10 MG/5ML syrup Take 5 mLs by mouth 4 (four) times daily as needed. 01/20/19   Joni ReiningSmith, Ronald K, PA-C  cyproheptadine (PERIACTIN) 4 MG tablet Take 1 tablet (4 mg total) by mouth 3 (three) times daily as needed for allergies. Patient not taking: Reported on 02/22/2017 01/27/17   Menshew, Charlesetta IvoryJenise V Bacon, PA-C  diphenhydrAMINE (BENADRYL) 25 mg capsule Take 1 capsule (25 mg total) by mouth every 6 (six) hours as needed. 02/07/14   Sunnie Nielsenpitz, Brian, MD    fluticasone (FLONASE) 50 MCG/ACT nasal spray Place 1 spray into both nostrils 2 (two) times daily. 01/03/18   Cuthriell, Delorise RoyalsJonathan D, PA-C  hydrOXYzine (ATARAX/VISTARIL) 50 MG tablet Take 1 tablet (50 mg total) by mouth 3 (three) times daily as needed. Patient not taking: Reported on 02/22/2017 06/19/15   Joni ReiningSmith, Ronald K, PA-C  ibuprofen (ADVIL,MOTRIN) 600 MG tablet Take 1 tablet (600 mg total) by mouth every 6 (six) hours as needed for pain. Patient not taking: Reported on 02/22/2017 03/26/13   Burgess AmorIdol, Julie, PA-C  ibuprofen (ADVIL,MOTRIN) 600 MG tablet Take 1 tablet (600 mg total) by mouth every 8 (eight) hours as needed. 01/20/19   Joni ReiningSmith, Ronald K, PA-C  magic mouthwash w/lidocaine SOLN Take 5 mLs by mouth 4 (four) times daily. 01/03/18   Cuthriell, Delorise RoyalsJonathan D, PA-C  naproxen (NAPROSYN) 500 MG tablet Take 1 tablet (500 mg total) by mouth 2 (two) times daily with a meal. Patient not taking: Reported on 02/22/2017 11/15/15   Jene EveryKinner, Robert, MD  nystatin (MYCOSTATIN) 100000 UNIT/ML suspension Take 2 mLs (200,000 Units total) by mouth 4 (four) times daily. 1ml to each cheek QID x 7 days 01/03/18   Cuthriell, Delorise RoyalsJonathan D, PA-C  ondansetron (ZOFRAN ODT) 4 MG disintegrating tablet Take 1 tablet (4 mg total) by mouth every 6 (six) hours as needed for nausea or vomiting. Patient not taking: Reported on 02/22/2017 11/18/16   Menshew, Charlesetta IvoryJenise V Bacon, PA-C  predniSONE (DELTASONE) 10 MG tablet Take 2 tablets (20 mg total) by mouth  daily. 02/22/17   Arnaldo Natal, MD  predniSONE (STERAPRED UNI-PAK 21 TAB) 10 MG (21) TBPK tablet Take by mouth daily. Patient not taking: Reported on 02/22/2017 01/27/17   Menshew, Charlesetta Ivory, PA-C  ranitidine (ZANTAC) 150 MG tablet Take 1 tablet (150 mg total) by mouth 2 (two) times daily. Patient not taking: Reported on 02/22/2017 01/27/17   Menshew, Charlesetta Ivory, PA-C  ranitidine (ZANTAC) 150 MG tablet Take 1 tablet (150 mg total) by mouth 2 (two) times daily. 02/22/17 02/22/18  Arnaldo Natal, MD    Allergies Peanut-containing drug products; Dilaudid [hydromorphone hcl]; Flounder [fish allergy]; Lidocaine; and Zithromax [azithromycin dihydrate]  No family history on file.  Social History Social History   Tobacco Use  . Smoking status: Former Smoker    Types: Cigarettes    Last attempt to quit: 12/01/2014    Years since quitting: 4.1  . Smokeless tobacco: Never Used  Substance Use Topics  . Alcohol use: Yes    Comment: weekends  . Drug use: No    Review of Systems  Constitutional: Fever and body aches.   Eyes: No visual changes. ENT: Sore throat.  Nasal congestion.   Cardiovascular: Denies chest pain. Respiratory: Denies shortness of breath.  Nonproductive cough. Gastrointestinal: No abdominal pain.  No nausea, no vomiting.  No diarrhea.  No constipation. Genitourinary: Negative for dysuria. Musculoskeletal: Negative for back pain. Skin: Negative for rash. Neurological: Positive for frontal headaches, but denies focal weakness or numbness. Psychiatric:  Panic attacks.. Allergic/Immunilogical: See allergy list. ____________________________________________   PHYSICAL EXAM:  VITAL SIGNS: ED Triage Vitals  Enc Vitals Group     BP 01/20/19 1339 (!) 141/97     Pulse Rate 01/20/19 1339 94     Resp 01/20/19 1339 18     Temp 01/20/19 1339 100.1 F (37.8 C)     Temp Source 01/20/19 1339 Oral     SpO2 01/20/19 1339 100 %     Weight 01/20/19 1340 155 lb (70.3 kg)     Height 01/20/19 1340 5\' 3"  (1.6 m)     Head Circumference --      Peak Flow --      Pain Score 01/20/19 1339 7     Pain Loc --      Pain Edu? --      Excl. in GC? --     Constitutional: Alert and oriented.  Moderate distress.  Febrile.   Eyes: Conjunctivae are normal. PERRL. EOMI. Head: Atraumatic. Nose: Bilateral maxillary guarding with edematous nasal turbinates. Mouth/Throat: Mucous membranes are moist.  Oropharynx  erythematous.  Postnasal drainage. Neck: No stridor.  Hematological/Lymphatic/Immunilogical: No cervical lymphadenopathy. Cardiovascular: Normal rate, regular rhythm. Grossly normal heart sounds.  Good peripheral circulation. Respiratory: Normal respiratory effort.  No retractions. Lungs CTAB. Gastrointestinal: Soft and nontender. No distention. No abdominal bruits. No CVA tenderness. Musculoskeletal: No lower extremity tenderness nor edema.  No joint effusions. Neurologic:  Normal speech and language. No gross focal neurologic deficits are appreciated. No gait instability. Skin:  Skin is warm, dry and intact. No rash noted. Psychiatric: Mood and affect are normal. Speech and behavior are normal.  ____________________________________________   LABS (all labs ordered are listed, but only abnormal results are displayed)  Labs Reviewed  GROUP A STREP BY PCR  INFLUENZA PANEL BY PCR (TYPE A & B)   ____________________________________________  EKG   ____________________________________________  RADIOLOGY  ED MD interpretation:    Official radiology report(s): No results found.  ____________________________________________  PROCEDURES  Procedure(s) performed: None  Procedures  Critical Care performed: No  ____________________________________________   INITIAL IMPRESSION / ASSESSMENT AND PLAN / ED COURSE  As part of my medical decision making, I reviewed the following data within the electronic MEDICAL RECORD NUMBER     Patient presents with acute onset of fever sore throat and nasal congestion for few days.  Patient tested negative for strep pharyngitis and influenza.  Patient feels exam consistent with viral respiratory infection and pharyngitis.  Patient given discharge care instruction advised take medication as directed.  Follow-up with PCP.      ____________________________________________   FINAL CLINICAL IMPRESSION(S) / ED DIAGNOSES  Final diagnoses:  Pharyngitis due to other organism  Viral URI with cough      ED Discharge Orders         Ordered    brompheniramine-pseudoephedrine-DM 30-2-10 MG/5ML syrup  4 times daily PRN     01/20/19 1559    ibuprofen (ADVIL,MOTRIN) 600 MG tablet  Every 8 hours PRN     01/20/19 1559           Note:  This document was prepared using Dragon voice recognition software and may include unintentional dictation errors.    Joni Reining, PA-C 01/20/19 1601    Jeanmarie Plant, MD 01/24/19 [phone]

## 2019-11-04 DIAGNOSIS — Z62810 Personal history of physical and sexual abuse in childhood: Secondary | ICD-10-CM

## 2019-11-07 ENCOUNTER — Other Ambulatory Visit: Payer: Self-pay

## 2019-11-07 ENCOUNTER — Ambulatory Visit (LOCAL_COMMUNITY_HEALTH_CENTER): Payer: Self-pay | Admitting: Physician Assistant

## 2019-11-07 VITALS — BP 134/90 | Ht 63.0 in | Wt 159.0 lb

## 2019-11-07 DIAGNOSIS — Z30017 Encounter for initial prescription of implantable subdermal contraceptive: Secondary | ICD-10-CM

## 2019-11-07 DIAGNOSIS — Z3009 Encounter for other general counseling and advice on contraception: Secondary | ICD-10-CM

## 2019-11-07 MED ORDER — ETONOGESTREL 68 MG ~~LOC~~ IMPL
68.0000 mg | DRUG_IMPLANT | Freq: Once | SUBCUTANEOUS | Status: AC
  Administered 2019-11-07: 68 mg via SUBCUTANEOUS

## 2019-11-07 NOTE — Progress Notes (Signed)
Pt scheduled in nurse clinic today for contraception.  Per patient would really like to have the Nexplanon. Has recently moved to [place].  Criss Rosales PA will work patient in for [place] insertion.  Consult complete Aileen Fass, RN   TANK device used. Aileen Fass, RN

## 2019-11-08 ENCOUNTER — Encounter: Payer: Self-pay | Admitting: Physician Assistant
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
## 2020-07-04 ENCOUNTER — Encounter (HOSPITAL_BASED_OUTPATIENT_CLINIC_OR_DEPARTMENT_OTHER): Payer: Self-pay | Admitting: Emergency Medicine

## 2020-07-04 ENCOUNTER — Emergency Department (HOSPITAL_BASED_OUTPATIENT_CLINIC_OR_DEPARTMENT_OTHER): Payer: Self-pay

## 2020-07-04 ENCOUNTER — Other Ambulatory Visit: Payer: Self-pay

## 2020-07-04 ENCOUNTER — Emergency Department (HOSPITAL_BASED_OUTPATIENT_CLINIC_OR_DEPARTMENT_OTHER)
Admission: EM | Admit: 2020-07-04 | Discharge: 2020-07-04 | Disposition: A | Discharge status: Home / Self Care | Payer: Self-pay | Attending: Emergency Medicine | Admitting: Emergency Medicine

## 2020-07-04 DIAGNOSIS — S4991XA Unspecified injury of right shoulder and upper arm, initial encounter: Secondary | ICD-10-CM | POA: Insufficient documentation

## 2020-07-04 DIAGNOSIS — M791 Myalgia, unspecified site: Secondary | ICD-10-CM | POA: Insufficient documentation

## 2020-07-04 DIAGNOSIS — Y929 Unspecified place or not applicable: Secondary | ICD-10-CM | POA: Insufficient documentation

## 2020-07-04 DIAGNOSIS — Y939 Activity, unspecified: Secondary | ICD-10-CM | POA: Insufficient documentation

## 2020-07-04 DIAGNOSIS — X58XXXA Exposure to other specified factors, initial encounter: Secondary | ICD-10-CM | POA: Insufficient documentation

## 2020-07-04 DIAGNOSIS — Y999 Unspecified external cause status: Secondary | ICD-10-CM | POA: Insufficient documentation

## 2020-07-04 HISTORY — DX: Anxiety disorder, unspecified: F41.9

## 2020-07-04 NOTE — Discharge Instructions (Addendum)
Please read instructions below. Apply ice to your shoulder for 20 minutes at a time. You can take 600mg  of ibuprofen every 6 hours as needed for pain. Schedule an appointment with the orthopedic specialist for further management of your injury. Return to the ER for new or concerning symptoms.
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
## 2020-09-12 ENCOUNTER — Ambulatory Visit (LOCAL_COMMUNITY_HEALTH_CENTER): Payer: BLUE CROSS/BLUE SHIELD | Admitting: Physician Assistant

## 2020-09-12 ENCOUNTER — Other Ambulatory Visit: Payer: Self-pay

## 2020-09-12 ENCOUNTER — Encounter: Payer: Self-pay | Admitting: Physician Assistant

## 2020-09-12 VITALS — BP 138/93 | Ht 64.0 in | Wt 153.0 lb

## 2020-09-12 DIAGNOSIS — Z3046 Encounter for surveillance of implantable subdermal contraceptive: Secondary | ICD-10-CM

## 2020-09-12 DIAGNOSIS — Z3009 Encounter for other general counseling and advice on contraception: Secondary | ICD-10-CM

## 2020-09-12 DIAGNOSIS — Z0000 Encounter for general adult medical examination without abnormal findings: Secondary | ICD-10-CM

## 2020-09-12 DIAGNOSIS — Z113 Encounter for screening for infections with a predominantly sexual mode of transmission: Secondary | ICD-10-CM

## 2020-09-12 LAB — WET PREP FOR TRICH, YEAST, CLUE
Trichomonas Exam: NEGATIVE
Yeast Exam: NEGATIVE
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
## 2020-09-12 NOTE — Progress Notes (Signed)
Pt is here for physical. BP 138/93. Pt denies any headaches, chest pain, blurred vision or other symptoms at this time. Pt desires Nexplanon removal and not sure if wants to be on birth control at this time and is thinking of abstaining from sex at this time. Pt reports unsure if Nexplanon is causing increased depression and also doesn't like that she doesn't have her periods. RN counseling for Nexplanon removal completed and consent form reviewed and signed by pt. Pt desires STD screening as she is having discharge. Pt filling out PHQ9.

## 2020-10-01 DIAGNOSIS — F419 Anxiety disorder, unspecified: Secondary | ICD-10-CM | POA: Insufficient documentation

## 2020-10-01 DIAGNOSIS — F32A Depression, unspecified: Secondary | ICD-10-CM | POA: Insufficient documentation

## 2020-10-09 ENCOUNTER — Other Ambulatory Visit: Payer: Self-pay

## 2020-10-09 ENCOUNTER — Ambulatory Visit: Payer: Self-pay | Admitting: Physician Assistant

## 2020-10-09 DIAGNOSIS — Z113 Encounter for screening for infections with a predominantly sexual mode of transmission: Secondary | ICD-10-CM

## 2020-10-09 DIAGNOSIS — Z299 Encounter for prophylactic measures, unspecified: Secondary | ICD-10-CM

## 2020-10-09 LAB — WET PREP FOR TRICH, YEAST, CLUE
Trichomonas Exam: NEGATIVE
Yeast Exam: NEGATIVE

## 2020-10-09 MED ORDER — METRONIDAZOLE 500 MG PO TABS: 2000.0000 mg | ORAL_TABLET | Freq: Once | ORAL | 0 refills | Status: AC

## 2020-10-10 NOTE — Progress Notes (Signed)
S:  Patient into clinic stating that she wants to be checked for BV due to white vaginal discharge for 1 week and odor for 2 days.  Denies other symptoms and declines other testing since she had it done about 1 month ago.   O:  WDWN female in NAD, A&O x 3, normal work of breathing, reviewed results of patient's last pelvic exam and all results are negative.  Patient self-collects sample today for wet mount= all negative. A/P:  1.  Reviewed with patient that the wet mount is negative for BV, yeast and Trich today. 2.  Patient insists that she wants medicine due to having the odor. 3.  Counseled patient that she could use OTC boric acid suppositories or gel to restore pH and the odor would resolve. 4.  Patient again insists on medicine and counseled that I could give her a 2 g dose of Metronidazole to take one time to see if that resolves the discharge and odor, but on her chart it says that the po medicine causes her to have dizziness and blurry vision.  Patient states that she is willing to take the po Metronidazole. 5.  Metronidazole 2 g po one time with food, no EtOH for 24 hr before and until 72 hr after taking medicine.  The patient was dispensed Metronidazole today. I provided counseling today regarding the medication. We discussed the medication, the side effects and when to call clinic. Patient given the opportunity to ask questions. Questions answered.  6.  Counseled patient that she may want to take OTC Benadryl or Dramamine 30-60 min prior to taking the dose of Metronidazole and to take it when she has several hours where she will be at home and not have to drive due to those medicines causing sleepiness. 7.  No sex for 7 days and use OTC antifungal cream if has itching after treatment. 8.  Call with questions or concerns. 9.  RTC prn.

## 2020-10-14 NOTE — Progress Notes (Signed)
Chart reviewed by Pharmacist  Suzanne Walker PharmD, Contract Pharmacist at [facility]  

## 2020-10-16 ENCOUNTER — Ambulatory Visit: Payer: BLUE CROSS/BLUE SHIELD

## 2020-12-04 ENCOUNTER — Emergency Department
Admission: EM | Admit: 2020-12-04 | Discharge: 2020-12-04 | Disposition: A | Discharge status: Home / Self Care | Payer: 59 | Attending: Emergency Medicine | Admitting: Emergency Medicine

## 2020-12-04 ENCOUNTER — Other Ambulatory Visit: Payer: Self-pay

## 2020-12-04 DIAGNOSIS — Z20822 Contact with and (suspected) exposure to covid-19: Secondary | ICD-10-CM | POA: Diagnosis not present

## 2020-12-04 DIAGNOSIS — Z87891 Personal history of nicotine dependence: Secondary | ICD-10-CM | POA: Insufficient documentation

## 2020-12-04 DIAGNOSIS — Z91010 Allergy to peanuts: Secondary | ICD-10-CM | POA: Insufficient documentation

## 2020-12-04 DIAGNOSIS — R519 Headache, unspecified: Secondary | ICD-10-CM | POA: Insufficient documentation

## 2020-12-04 LAB — SARS CORONAVIRUS 2 (TAT 6-24 HRS): SARS Coronavirus 2: NEGATIVE

## 2020-12-04 NOTE — ED Triage Notes (Signed)
Pt comes via POV from home with c/o headache that started today. Pt states she has been exposed to someone who tested positive.
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
## 2020-12-13 ENCOUNTER — Emergency Department: Payer: 59

## 2020-12-13 ENCOUNTER — Other Ambulatory Visit: Payer: Self-pay

## 2020-12-13 DIAGNOSIS — R55 Syncope and collapse: Secondary | ICD-10-CM | POA: Insufficient documentation

## 2020-12-13 DIAGNOSIS — Z91010 Allergy to peanuts: Secondary | ICD-10-CM | POA: Diagnosis not present

## 2020-12-13 DIAGNOSIS — Z79899 Other long term (current) drug therapy: Secondary | ICD-10-CM | POA: Insufficient documentation

## 2020-12-13 DIAGNOSIS — Z87891 Personal history of nicotine dependence: Secondary | ICD-10-CM | POA: Insufficient documentation

## 2020-12-13 DIAGNOSIS — J069 Acute upper respiratory infection, unspecified: Secondary | ICD-10-CM | POA: Insufficient documentation

## 2020-12-13 LAB — BASIC METABOLIC PANEL
Anion gap: 10 (ref 5–15)
BUN: 10 mg/dL (ref 6–20)
CO2: 25 mmol/L (ref 22–32)
Calcium: 8.4 mg/dL — ABNORMAL LOW (ref 8.9–10.3)
Chloride: 105 mmol/L (ref 98–111)
Creatinine, Ser: 0.86 mg/dL (ref 0.44–1.00)
GFR, Estimated: >60 mL/min (ref >60)
Glucose, Bld: 108 mg/dL — ABNORMAL HIGH (ref 70–99)
Potassium: 3.9 mmol/L (ref 3.5–5.1)
Sodium: 140 mmol/L (ref 135–145)

## 2020-12-13 LAB — CBC
HCT: 41.1 % (ref 36.0–46.0)
Hemoglobin: 13.7 g/dL (ref 12.0–15.0)
MCH: 31.2 pg (ref 26.0–34.0)
MCHC: 33.3 g/dL (ref 30.0–36.0)
MCV: 93.6 fL (ref 80.0–100.0)
Platelets: 212 10*3/uL (ref 150–400)
RBC: 4.39 MIL/uL (ref 3.87–5.11)
RDW: 12.8 % (ref 11.5–15.5)
WBC: 13.1 10*3/uL — ABNORMAL HIGH (ref 4.0–10.5)
nRBC: 0 % (ref 0.0–0.2)

## 2020-12-13 LAB — CBG MONITORING, ED: Glucose-Capillary: 97 mg/dL (ref 70–99)

## 2020-12-13 NOTE — ED Triage Notes (Signed)
Pt arrives w cc of syncope anmd fever/covid symptoms. Pt states she was tested last week and yesterday, both negative. Reports fever at home of 100, took ibuprofen at 7pm. Reports 4 syncopal episodes over past 2 days, lasted a few seconds, no falls/hitting of head. Reports productive cough, green phlegm and CP with deep breaths.

## 2020-12-14 ENCOUNTER — Emergency Department
Admission: EM | Admit: 2020-12-14 | Discharge: 2020-12-14 | Disposition: A | Discharge status: Home / Self Care | Payer: 59 | Attending: Emergency Medicine | Admitting: Emergency Medicine

## 2020-12-14 DIAGNOSIS — R55 Syncope and collapse: Secondary | ICD-10-CM

## 2020-12-14 DIAGNOSIS — J069 Acute upper respiratory infection, unspecified: Secondary | ICD-10-CM

## 2020-12-14 MED ORDER — DOXYCYCLINE HYCLATE 50 MG PO CAPS: 100.0000 mg | ORAL_CAPSULE | Freq: Two times a day (BID) | ORAL | 0 refills | Status: AC

## 2020-12-14 MED ORDER — DOXYCYCLINE HYCLATE 100 MG PO TABS
100.0000 mg | ORAL_TABLET | Freq: Once | ORAL | Status: AC
  Administered 2020-12-14: 100 mg via ORAL
  Filled 2020-12-14: qty 1
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
## 2021-01-24 ENCOUNTER — Other Ambulatory Visit: Payer: Self-pay

## 2021-01-24 ENCOUNTER — Ambulatory Visit
Admission: EM | Admit: 2021-01-24 | Discharge: 2021-01-24 | Disposition: A | Discharge status: Home / Self Care | Payer: 59 | Attending: Sports Medicine | Admitting: Sports Medicine

## 2021-01-24 ENCOUNTER — Encounter: Payer: Self-pay | Admitting: Emergency Medicine

## 2021-01-24 DIAGNOSIS — R42 Dizziness and giddiness: Secondary | ICD-10-CM

## 2021-01-24 DIAGNOSIS — R110 Nausea: Secondary | ICD-10-CM

## 2021-01-24 MED ORDER — MECLIZINE HCL 25 MG PO TABS: 25.0000 mg | ORAL_TABLET | Freq: Three times a day (TID) | ORAL | 0 refills | Status: DC | PRN

## 2021-01-24 NOTE — Discharge Instructions (Signed)
Take the medicine as needed. I provided a work note saying you were seen today. If your symptoms were to worsen in any way you should go straight to the emergency room for further evaluation and management. Please keep the appointment with your primary care physician for Monday and see if the medication is helping and if any further treatment is necessary.

## 2021-01-24 NOTE — ED Triage Notes (Signed)
Patient in today c/o dizziness and nausea x 4 days, getting worse. Patient has not taken any OTC medications to help with her symptoms.
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
## 2021-03-11 ENCOUNTER — Other Ambulatory Visit: Payer: Self-pay

## 2021-03-11 ENCOUNTER — Ambulatory Visit (INDEPENDENT_AMBULATORY_CARE_PROVIDER_SITE_OTHER): Payer: 59 | Admitting: Family Medicine

## 2021-03-11 ENCOUNTER — Ambulatory Visit: Payer: Self-pay

## 2021-03-11 ENCOUNTER — Encounter: Payer: Self-pay | Admitting: Family Medicine

## 2021-03-11 VITALS — BP 116/70 | HR 96 | Ht 63.0 in | Wt 174.0 lb

## 2021-03-11 DIAGNOSIS — M25511 Pain in right shoulder: Secondary | ICD-10-CM

## 2021-03-11 NOTE — Patient Instructions (Signed)
Thank you for coming in today.  I've referred you to Physical Therapy.  Let us know if you don't hear from them in one week.  Please use voltaren gel up to 4x daily for pain as needed.   Ok to work if you can. If you cant you cant.   Recheck in 1 month.

## 2021-03-18 ENCOUNTER — Encounter: Payer: Self-pay | Admitting: Family Medicine

## 2021-03-18 ENCOUNTER — Telehealth: Payer: Self-pay | Admitting: Family Medicine

## 2021-03-18 DIAGNOSIS — M25511 Pain in right shoulder: Secondary | ICD-10-CM

## 2021-03-18 MED ORDER — TRAMADOL HCL 50 MG PO TABS
50.0000 mg | ORAL_TABLET | Freq: Three times a day (TID) | ORAL | 0 refills | Status: DC | PRN
Start: 2021-03-18 — End: 2021-06-11

## 2021-03-18 MED ORDER — LORAZEPAM 0.5 MG PO TABS: ORAL_TABLET | ORAL | 0 refills | Status: DC

## 2021-03-18 NOTE — Telephone Encounter (Signed)
I called Donna Wallace   Not doing well.  Unable to work normally.  On max oral NSAIDs.   Plan MRI with oral sedation (ativan)   Plan limited tramadol at work  [employer] after MRI.

## 2021-04-02 DIAGNOSIS — M25511 Pain in right shoulder: Secondary | ICD-10-CM | POA: Diagnosis not present

## 2021-04-05 DIAGNOSIS — M25511 Pain in right shoulder: Secondary | ICD-10-CM | POA: Diagnosis not present
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
## 2021-04-10 ENCOUNTER — Ambulatory Visit (INDEPENDENT_AMBULATORY_CARE_PROVIDER_SITE_OTHER): Payer: 59 | Admitting: Family Medicine

## 2021-04-10 ENCOUNTER — Ambulatory Visit: Payer: Self-pay

## 2021-04-10 ENCOUNTER — Other Ambulatory Visit: Payer: Self-pay

## 2021-04-10 VITALS — BP 132/86 | HR 81 | Ht 63.0 in | Wt 179.8 lb

## 2021-04-10 DIAGNOSIS — M25511 Pain in right shoulder: Secondary | ICD-10-CM

## 2021-04-10 NOTE — Patient Instructions (Addendum)
Thank you for coming in today.   You should hear from MRI scheduling within 1 week. If you do not hear please let me know.    Keep me updated.    

## 2021-04-14 ENCOUNTER — Other Ambulatory Visit: Payer: Self-pay

## 2021-04-14 ENCOUNTER — Ambulatory Visit (INDEPENDENT_AMBULATORY_CARE_PROVIDER_SITE_OTHER): Payer: 59

## 2021-04-14 DIAGNOSIS — M25511 Pain in right shoulder: Secondary | ICD-10-CM

## 2021-04-15 ENCOUNTER — Encounter: Payer: Self-pay | Admitting: Family Medicine

## 2021-04-15 NOTE — Progress Notes (Signed)
Dominant finding in the MRI is developing adhesive capsulitis (frozen shoulder).  You do have a tiny bit of rotator cuff tendinitis but that is not the main issue.  Adhesive capsulitis is treated with an injection and physical therapy.  However the injection is different than the injection he had prior.  We can do this when you follow-up with me on the 18th.

## 2021-04-16 ENCOUNTER — Ambulatory Visit (INDEPENDENT_AMBULATORY_CARE_PROVIDER_SITE_OTHER): Payer: 59 | Admitting: Certified Nurse Midwife

## 2021-04-16 ENCOUNTER — Telehealth: Payer: Self-pay | Admitting: Certified Nurse Midwife

## 2021-04-16 ENCOUNTER — Encounter: Payer: Self-pay | Admitting: Certified Nurse Midwife

## 2021-04-16 ENCOUNTER — Other Ambulatory Visit: Payer: Self-pay

## 2021-04-16 VITALS — BP 113/77 | HR 94 | Resp 16 | Wt 178.2 lb

## 2021-04-16 DIAGNOSIS — Z30432 Encounter for removal of intrauterine contraceptive device: Secondary | ICD-10-CM | POA: Diagnosis not present

## 2021-04-16 MED ORDER — NORETHIN ACE-ETH ESTRAD-FE 1-20 MG-MCG PO TABS: 1.0000 | ORAL_TABLET | Freq: Every day | ORAL | 11 refills | Status: DC

## 2021-04-16 NOTE — Progress Notes (Signed)
   GYNECOLOGY OFFICE PROCEDURE NOTE  Donna Wallace is a 30 y.o. G3P0030 here for Mirena IUD removal. No GYN concerns.  Last pap [facility] on 2022[facility] and was normal per pt.She had the IUD placed in December and has had recurrent BV/Yeast infections.   IUD Removal  Patient identified, informed consent performed, consent signed.  Patient was in the dorsal lithotomy position, normal external genitalia was noted.  A speculum was placed in the patient's vagina, normal discharge was noted, no lesions. The cervix was visualized, no lesions, no abnormal discharge.  The strings of the IUD were grasped and pulled using ring forceps. The IUD was removed in its entirety. Patient tolerated the procedure well.    Patient will use ocp for contraception she denies any contraindications for use.  Routine preventative health maintenance measures emphasized.   Doreene Burke, CNM

## 2021-04-16 NOTE — Patient Instructions (Signed)
Oral Contraception Use Oral contraceptive pills (OCPs) are medicines that prevent pregnancy. OCPs work by:  Preventing the ovaries from releasing eggs.  Thickening mucus in the lower part of the uterus (cervix). This prevents sperm from entering the uterus.  Thinning the lining of the uterus (endometrium). This prevents a fertilized egg from attaching to the endometrium. Discuss possible side effects of OCPs with your health care provider. It can take 2-3 months for your body to adjust to changes in hormone levels. What are the risks? OCPs can sometimes cause side effects, such as:  Headache.  Depression.  Trouble sleeping.  Nausea and vomiting.  Breast tenderness.  Irregular bleeding or spotting during the first several months.  Bloating or fluid retention.  Increase in blood pressure. OCPs with estrogen and progestins may slightly increase the risk of:  Blood clots.  Heart attack.  Stroke. How to take OCPs Follow instructions from your health care provider about how to take your first cycle of OCPs. There are 2 types of OCPs. The first, combination OCPs, have both estrogen and progestins. The second, progestin-only pills, have only progestin.  For combination OCPs, you may start the pill: ? On day 1 of your menstrual period. ? On the first Sunday after your period starts, or on the day you get your prescription. ? At any time of your cycle. ? If you start taking the pill within 5 days after the start of your period, you will not need a backup form of birth control, such as condoms. ? If you start at any other time of your menstrual cycle, you will need to use a backup form of birth control.  For progestin-only OCPs: ? Ideally, you can start taking the pill on the first day of your menstrual period, but you can start it on any other day too. ? These pills will protect you from pregnancy after taking it for 2 days (48 hours). You can stop using a backup form of birth  control after that time. It is important that you take this pill at the same time every day. Even taking it 3 hours late can increase the risk of pregnancy. No matter which day you start the OCP, you will always start a new pack on that same day of the week. Have an extra pack of OCPs and a backup contraceptive method available in case you miss some pills or lose your OCP pack. Missed doses Follow instructions from your health care provider for missed doses. Information about missed doses can also be found in the patient information sheet that comes with your pack of pills.  In general, for combined OCPs: ? If you forget to take the pill for 1 day, take it as soon as you can. This may mean taking 2 pills on the same day and at the same time. Take the next day's pill at the regular time. ? If you forget to take the pill for 2 days in a row, take 2 tablets on the day you remember and 2 tablets on the following day. A backup form of birth control should be used for 7 days after you are back on schedule. ? If you forget to take the pill for 3 days in a row, call your health care provider for directions on when to restart taking your pills. Do not take the missed pills. A backup form of birth control will be needed for 7 days once you restart your pills. ? If you use a pack that   contains inactive pills and you miss 1 or more of the inactive pills, you do not need to take the missed doses. Skip them and start the new pack on the regular day.  For progestin-only OCPs: ? If your dose is 3 hours or more late, or if you miss 1 or more doses, take 1 missed pill as soon as you can. ? If you miss one or more doses, you must use a backup form of birth control. Some brands of progestin-only pills recommend using a backup form of birth control for 48 hours after a missed or late dose while others recommend 7 days. If you are not sure what to do, call your health care provider or check the patient information sheet that  came with your pills.   Follow these instructions at home:  Do not use any products that contain nicotine or tobacco. These include cigarettes, chewing tobacco, or vaping devices, such as e-cigarettes. If you need help quitting, ask your health care provider.  Always use a condom to protect against STIs (sexually transmitted infections). Oral contraception pills do not protect against STIs.  Use a calendar to mark the days of your menstrual period.  Read the information sheet and directions that came with your OCP. Talk to your health care provider if you have questions. Contact a health care provider if:  You develop nausea and vomiting.  You have abnormal vaginal discharge or bleeding.  You develop a rash.  You miss your menstrual period. Depending on the type of OCP you are taking, this may be a sign of pregnancy.  You are losing your hair.  You need treatment for mood swings or depression.  You get dizzy when taking the OCP.  You develop acne after taking the OCP.  You become pregnant or think you may be pregnant.  You have diarrhea, constipation, and abdominal pain or cramps.  You are not sure what to do after missing pills. Get help right away if:  You develop chest pain.  You develop shortness of breath.  You have an uncontrolled or severe headache.  You develop numbness or slurred speech.  You develop vision or speech problems.  You develop pain, redness, and swelling in your legs.  You develop weakness or numbness in your arms or legs. These symptoms may represent a serious problem that is an emergency. Do not wait to see if the symptoms will go away. Get medical help right away. Call your local emergency services (911 in the [place] Do not drive yourself to the hospital. Summary  Oral contraceptive pills (OCPs) are medicines that you take to prevent pregnancy.  OCPs do not prevent sexually transmitted infections (STIs). Always use a condom to protect  against STIs.  When you start an OCP, be aware that it can take 2-3 months for your body to adjust to changes in hormone levels.  Read all the information and directions that come with your OCP. This information is not intended to replace advice given to you by your health care provider. Make sure you discuss any questions you have with your health care provider. Document Revised: 07/26/2020 Document Reviewed: 07/26/2020 Elsevier Patient Education  2021 [employer]  

## 2021-04-16 NOTE — Progress Notes (Signed)
I, Christoper Fabian, LAT, ATC, am serving as scribe for Dr. [name].  Donna Wallace is a 30 y.o. female who presents to [facility] at [facility] today for f/u of R shoulder pain and R shoulder MRI review.  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  She was last seen by Dr. [name] on 04/10/21 w/ worsening pain in her R shoulder and was re-referred for a R shoulder MRI.  She has completed 2 PT sessions at [facility].  Since her last visit, pt reports that her R shoulder remains about the same and is now having some increased pain along her R superior scapula/upper trap.  She con't to not work due to the pain and she has not attended any more PT sessions.  Diagnostic imaging: R shoulder MRI- 04/14/21; R shoulder XR- 07/04/20  Pertinent review of systems: No fevers or chills  Relevant historical information: No diabetes or thyroid disease.   Exam:  BP 118/78 (BP Location: Left Arm, Patient Position: Sitting, Cuff Size: Normal)   Pulse 85   Ht 5\' 3"  (1.6 m)   Wt 181 lb 9.6 oz (82.4 kg)   SpO2 99%   BMI 32.17 kg/m  General: Well Developed, well nourished, and in no acute distress.   MSK: Right shoulder normal-appearing decreased motion abduction and external rotation.    Lab and Radiology Results No results found for this or any previous visit (from the past 72 hour(s)). MR SHOULDER RIGHT WO CONTRAST  Result Date: 04/15/2021 CLINICAL DATA:  Right shoulder pain.  Injury in July 2021. EXAM: MRI OF THE RIGHT SHOULDER WITHOUT CONTRAST TECHNIQUE: Multiplanar, multisequence MR imaging of the shoulder was performed. No intravenous contrast was administered. COMPARISON:  Radiographs 07/04/2020 FINDINGS: Rotator cuff: The rotator cuff tendons are intact. Minimal/mild tendinopathy. Muscles:  Normal Biceps long head:  Intact Acromioclavicular Joint: Minimal degenerative changes. Type 2 acromion. No significant lateral  downsloping or subacromial spurring. Glenohumeral Joint: Small joint effusion. The articular cartilage is intact. There is mild thickening of the capsular structures in the axillary recess which can be seen with synovitis or adhesive capsulitis. Labrum:  No definite labral tears. Bones:  No acute bony findings. Other: No subacromial/subdeltoid fluid collections to suggest bursitis. IMPRESSION: 1. Minimal/mild rotator cuff tendinopathy. No tear. 2. Intact long head biceps tendon and glenoid labrum. 3. There is mild thickening of the capsular structures in the axillary recess which can be seen with synovitis or adhesive capsulitis. Electronically Signed   By: 09/03/2020 M.D.   On: 04/15/2021 15:25    I, 04/17/2021, personally (independently) visualized and performed the interpretation of the images attached in this note.  Procedure: Real-time Ultrasound Guided Injection of right shoulder glenohumeral joint posterior approach Device: Philips Affiniti 50G Images permanently stored and available for review in PACS Verbal informed consent obtained.  Discussed risks and benefits of procedure. Warned about infection bleeding damage to structures skin hypopigmentation and fat atrophy among others. Patient expresses understanding and agreement Time-out conducted.   Noted no overlying erythema, induration, or other signs of local infection.   Skin prepped in a sterile fashion.   Local anesthesia: Topical Ethyl chloride.   With sterile technique and under real time ultrasound guidance:  40 mg of Kenalog and 2 mL of Marcaine injected into shoulder joint. Fluid seen entering the joint capsule.   Completed without difficulty   Pain immediately resolved suggesting accurate  placement of the medication.   Advised to call if fevers/chills, erythema, induration, drainage, or persistent bleeding.   Images permanently stored and available for review in the ultrasound unit.  Impression: Technically successful  ultrasound guided injection.      Assessment and Plan: 30 y.o. female with right shoulder pain failing conservative management.  Based on updated MRI pain predominantly due to adhesive capsulitis.  She does have a tiny amount of tendinopathy with AC DJD and potential for impingement with type II acromion.  Plan for glenohumeral injection and physical therapy and home exercise program focused on range of motion.  Recheck in 6 weeks.  If failing typical conservative management for adhesive capsulitis would recommend surgical second opinion/consultation.  She may benefit from manipulation under anesthesia with subacromial decompression and distal clavicle excision.   PDMP not reviewed this encounter. Orders Placed This Encounter  Procedures  . [place] LIMITED JOINT SPACE STRUCTURES UP RIGHT(NO LINKED CHARGES)    Order Specific Question:   Reason for Exam (SYMPTOM  OR DIAGNOSIS REQUIRED)    Answer:   R shoulder pain    Order Specific Question:   Preferred imaging location?    Answer:   Adult nurse [facility]  . Ambulatory referral to Physical Therapy    Referral Priority:   Routine    Referral Type:   Physical Medicine    Referral Reason:   Specialty Services Required    Requested Specialty:   Physical Therapy    Number of Visits Requested:   1   No orders of the defined types were placed in this encounter.    Discussed warning signs or symptoms. Please see discharge instructions. Patient expresses understanding.   The above documentation has been reviewed and is accurate and complete [name], M.D.  Total encounter time 20 minutes including face-to-face time with the patient and, reviewing past medical record, and charting on the date of service.   Discussed MRI findings treatment plan and options.  Time excludes time to perform injection.

## 2021-04-16 NOTE — Telephone Encounter (Signed)
Sent patient message through my chart

## 2021-04-16 NOTE — Telephone Encounter (Signed)
Patient called asking about rx that was prescribed today- states that pharmacy has not received the rx yet. I confirmed the pharmacy as [facility] [address] in [place]. Rx: Birth control and bream for bacterial infection. Please Advise.

## 2021-04-17 ENCOUNTER — Encounter: Payer: Self-pay | Admitting: Family Medicine

## 2021-04-17 ENCOUNTER — Ambulatory Visit (INDEPENDENT_AMBULATORY_CARE_PROVIDER_SITE_OTHER): Payer: 59 | Admitting: Family Medicine

## 2021-04-17 ENCOUNTER — Ambulatory Visit: Payer: Self-pay

## 2021-04-17 VITALS — BP 118/78 | HR 85 | Ht 63.0 in | Wt 181.6 lb

## 2021-04-17 DIAGNOSIS — M25511 Pain in right shoulder: Secondary | ICD-10-CM

## 2021-04-17 DIAGNOSIS — M7501 Adhesive capsulitis of right shoulder: Secondary | ICD-10-CM

## 2021-04-17 NOTE — Patient Instructions (Addendum)
Good to see you today.  You had a R shoulder injection.  Call or go to the ER if you develop a large red swollen joint with extreme pain or oozing puss.   Plan for PT.   Work hard on home exercises.   Please perform the exercise program that we have prepared for you and gone over in detail on a daily basis.  In addition to the handout you were provided you can access your program through: [email or web address]   Your unique program code is: [email or web address]   Recheck in 6 weeks.   If this is just not working let me know and I will get you a surgical second opponens.    Adhesive Capsulitis  Adhesive capsulitis, also called frozen shoulder, causes the shoulder to become stiff and painful to move. This condition happens when there is inflammation of the tendons and ligaments that surround the shoulder joint (shoulder capsule). What are the causes? This condition may be caused by:  An injury to your shoulder joint.  Straining your shoulder.  Not moving your shoulder for a period of time. This can happen if your arm was injured or in a sling.  Long-standing conditions, such as: ? Diabetes. ? Thyroid problems. ? Heart disease. ? Stroke. ? Rheumatoid arthritis. ? Lung disease. In some cases, the cause is not known. What increases the risk? You are more likely to develop this condition if you are:  A woman.  Older than 31 years of age. What are the signs or symptoms? Symptoms of this condition include:  Pain in your shoulder when you move your arm. There may also be pain when parts of your shoulder are touched. The pain may be worse at night or when you are resting.  A sore or aching shoulder.  The inability to move your shoulder normally.  Muscle spasms. How is this diagnosed? This condition is diagnosed with a physical exam and imaging tests, such as an X-ray or MRI. How is this treated? This condition may be treated with:  Treatment of the underlying cause or  condition.  Medicine. Medicine may be given to relieve pain, inflammation, or muscle spasms.  Steroid injections into the shoulder joint.  Physical therapy. This involves performing exercises to get the shoulder moving again.  Acupuncture. This is a type of treatment that involves stimulating specific points on your body by inserting thin needles through your skin.  Shoulder manipulation. This is a procedure to move the shoulder into another position. It is done after you are given a medicine to make you fall asleep (general anesthetic). The joint may also be injected with salt water at high pressure to break down scarring.  Surgery. This may be done in severe cases when other treatments have failed. Although most people recover completely from adhesive capsulitis, some may not regain full shoulder movement. Follow these instructions at home: Managing pain, stiffness, and swelling  If directed, put ice on the injured area: ? Put ice in a plastic bag. ? Place a towel between your skin and the bag. ? Leave the ice on for 20 minutes, 2-3 times per day.  If directed, apply heat to the affected area before you exercise. Use the heat source that your health care provider recommends, such as a moist heat pack or a heating pad. ? Place a towel between your skin and the heat source. ? Leave the heat on for 20-30 minutes. ? Remove the heat if your skin turns bright red. This is especially  important if you are unable to feel pain, heat, or cold. You may have a greater risk of getting burned.      General instructions  Take over-the-counter and prescription medicines only as told by your health care provider.  If you are being treated with physical therapy, follow instructions from your physical therapist.  Avoid exercises that put a lot of demand on your shoulder, such as throwing. These exercises can make pain worse.  Keep all follow-up visits as told by your health care provider. This is  important. Contact a health care provider if:  You develop new symptoms.  Your symptoms get worse. Summary  Adhesive capsulitis, also called frozen shoulder, causes the shoulder to become stiff and painful to move.  You are more likely to have this condition if you are a woman and over age 72.  It is treated with physical therapy, medicines, and sometimes surgery. This information is not intended to replace advice given to you by your health care provider. Make sure you discuss any questions you have with your health care provider. Document Revised: 04/23/2018 Document Reviewed: 04/23/2018 Elsevier Patient Education  2021 [employer].

## 2021-05-28 NOTE — Progress Notes (Signed)
Donna Wallace, am serving as a Neurosurgeon for Dr. [name].  Donna Wallace is a 30 y.o. female who presents to [facility] at [facility] today for f/u of R shoulder pain.  She was last seen by Dr. [name] on 04/17/21 to review her R shoulder MRI and noted no change in her symptoms.  She had a R GHJ injection.  She had d/c PT due to noticing no change in her symptoms but was re-referred to PT.  She was also shown a HEP focusing on R shoulder ROM.  Since her last visit, pt reports shoulder is still the same and the knot is still there on the top of the shoulder.   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  She has daily pain and is very discouraged by her shoulder.  Diagnostic imaging: R shoulder MRI - 04/14/21; R shoulder XR- 07/04/20  Pertinent review of systems: No fevers or chills  Relevant historical information: Frozen shoulder.  History depression   Exam:  BP 122/80 (BP Location: Left Arm, Patient Position: Sitting, Cuff Size: Normal)   Pulse 81   Ht 5\' 3"  (1.6 m)   Wt 181 lb (82.1 kg)   SpO2 99%   BMI 32.06 kg/m  General: Well Developed, well nourished, and in no acute distress.   MSK: Right shoulder normal-appearing Mildly tender palpation anterior shoulder. Range of motion lacks full extension by approximately 20 degrees.  Internal rotation to lumbar spine external rotation lacks full rotation by 10 degrees. Strength intact.    Lab and Radiology Results MR SHOULDER RIGHT WO CONTRAST  Result Date: 04/15/2021 CLINICAL DATA:  Right shoulder pain.  Injury in July 2021. EXAM: MRI OF THE RIGHT SHOULDER WITHOUT CONTRAST TECHNIQUE: Multiplanar, multisequence MR imaging of the shoulder was performed. No intravenous contrast was administered. COMPARISON:  Radiographs 07/04/2020 FINDINGS: Rotator cuff: The rotator cuff tendons are intact. Minimal/mild tendinopathy.  Muscles:  Normal Biceps long head:  Intact Acromioclavicular Joint: Minimal degenerative changes. Type 2 acromion. No significant lateral downsloping or subacromial spurring. Glenohumeral Joint: Small joint effusion. The articular cartilage is intact. There is mild thickening of the capsular structures in the axillary recess which can be seen with synovitis or adhesive capsulitis. Labrum:  No definite labral tears. Bones:  No acute bony findings. Other: No subacromial/subdeltoid fluid collections to suggest bursitis. IMPRESSION: 1. Minimal/mild rotator cuff tendinopathy. No tear. 2. Intact long head biceps tendon and glenoid labrum. 3. There is mild thickening of the capsular structures in the axillary recess which can be seen with synovitis or adhesive capsulitis. Electronically Signed   By: 09/03/2020 M.D.   On: 04/15/2021 15:25   04/17/2021 LIMITED JOINT SPACE STRUCTURES UP RIGHT(NO LINKED CHARGES)  Result Date: 04/19/2021 Procedure: Real-time Ultrasound Guided Injection of right shoulder glenohumeral joint posterior approach Device: Philips Affiniti 50G Images permanently stored and available for review in PACS Verbal informed consent obtained.  Discussed risks and benefits of procedure. Warned about infection bleeding damage to structures skin hypopigmentation and fat atrophy among others. Patient expresses understanding and agreement Time-out conducted.   Noted no overlying erythema, induration, or other signs of local infection.   Skin prepped in a sterile fashion.   Local anesthesia: Topical Ethyl chloride.   With sterile technique and under real time ultrasound guidance:  40 mg of Kenalog and 2 mL of Marcaine injected into shoulder joint. Fluid seen entering  the joint capsule.   Completed without difficulty   Pain immediately resolved suggesting accurate placement of the medication.   Advised to call if fevers/chills, erythema, induration, drainage, or persistent bleeding.   Images permanently stored and  available for review in the ultrasound unit. Impression: Technically successful ultrasound guided injection.   I, [name], personally (independently) visualized and performed the interpretation of the MRI images attached in this note.      Assessment and Plan: 30 y.o. female with persistent right shoulder pain due to adhesive capsulitis.  Patient has had extensive trials of treatment including prior to my care subacromial injections, trials of physical therapy, and prior MRI arthrogram.  Recently she has had a recent repeat shoulder MRI which showed adhesive capsulitis.  She had trialed glenohumeral injection and repeat trial of PT/home exercise program with little benefit.  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  She is frustrated and willing to consider surgery.  Plan for surgical referral and consultation at this point.  She may be a good candidate for manipulation under anesthesia and potential subacromial decompression.  Referral placed today.   PDMP not reviewed this encounter. Orders Placed This Encounter  Procedures   Ambulatory referral to Orthopedic Surgery    Referral Priority:   Routine    Referral Type:   Surgical    Referral Reason:   Specialty Services Required    Requested Specialty:   Orthopedic Surgery    Number of Visits Requested:   1   No orders of the defined types were placed in this encounter.    Discussed warning signs or symptoms. Please see discharge instructions. Patient expresses understanding.   The above documentation has been reviewed and is accurate and complete [name], M.D.

## 2021-05-29 ENCOUNTER — Ambulatory Visit (INDEPENDENT_AMBULATORY_CARE_PROVIDER_SITE_OTHER): Payer: 59 | Admitting: Family Medicine

## 2021-05-29 ENCOUNTER — Encounter: Payer: Self-pay | Admitting: Family Medicine

## 2021-05-29 ENCOUNTER — Other Ambulatory Visit: Payer: Self-pay

## 2021-05-29 VITALS — BP 122/80 | HR 81 | Ht 63.0 in | Wt 181.0 lb

## 2021-05-29 DIAGNOSIS — M7501 Adhesive capsulitis of right shoulder: Secondary | ICD-10-CM | POA: Diagnosis not present

## 2021-05-29 NOTE — Patient Instructions (Signed)
Thank you for coming in today.   I have referred you to orthopedics.  Let me know if you do not hear anything.   Make sure you are clear with the surgeon what your goals are.  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

## 2021-05-31 ENCOUNTER — Encounter: Payer: Self-pay | Admitting: Orthopaedic Surgery

## 2021-06-11 ENCOUNTER — Ambulatory Visit (INDEPENDENT_AMBULATORY_CARE_PROVIDER_SITE_OTHER): Payer: 59 | Admitting: Orthopaedic Surgery

## 2021-06-11 ENCOUNTER — Encounter: Payer: Self-pay | Admitting: Orthopaedic Surgery

## 2021-06-11 DIAGNOSIS — M24611 Ankylosis, right shoulder: Secondary | ICD-10-CM | POA: Diagnosis not present
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
## 2021-06-25 ENCOUNTER — Other Ambulatory Visit: Payer: Self-pay

## 2021-06-25 ENCOUNTER — Telehealth: Payer: Self-pay | Admitting: Certified Nurse Midwife

## 2021-06-25 MED ORDER — NORETHIN ACE-ETH ESTRAD-FE 1-20 MG-MCG PO TABS: 1.0000 | ORAL_TABLET | Freq: Every day | ORAL | 1 refills | Status: DC

## 2021-06-25 NOTE — Telephone Encounter (Signed)
Pt called stating that she needs a refill on birth control pills, she is currently on the last week of her last pack. Pt could not name medication. Please Advise.

## 2021-07-03 ENCOUNTER — Ambulatory Visit: Payer: BLUE CROSS/BLUE SHIELD

## 2021-07-04 ENCOUNTER — Other Ambulatory Visit: Payer: Self-pay

## 2021-07-04 ENCOUNTER — Encounter (HOSPITAL_BASED_OUTPATIENT_CLINIC_OR_DEPARTMENT_OTHER): Payer: Self-pay | Admitting: Orthopaedic Surgery

## 2021-07-04 ENCOUNTER — Other Ambulatory Visit: Payer: Self-pay | Admitting: Physician Assistant

## 2021-07-05 NOTE — Progress Notes (Signed)

## 2021-07-11 ENCOUNTER — Ambulatory Visit (HOSPITAL_BASED_OUTPATIENT_CLINIC_OR_DEPARTMENT_OTHER)
Admission: RE | Admit: 2021-07-11 | Discharge: 2021-07-11 | Disposition: A | Discharge status: Home / Self Care | Payer: 59 | Attending: Orthopaedic Surgery | Admitting: Orthopaedic Surgery

## 2021-07-11 ENCOUNTER — Ambulatory Visit (HOSPITAL_BASED_OUTPATIENT_CLINIC_OR_DEPARTMENT_OTHER): Payer: 59 | Admitting: Anesthesiology

## 2021-07-11 ENCOUNTER — Encounter (HOSPITAL_BASED_OUTPATIENT_CLINIC_OR_DEPARTMENT_OTHER)
Admission: RE | Disposition: A | Discharge status: Home / Self Care | Payer: Self-pay | Source: Home / Self Care | Attending: Orthopaedic Surgery

## 2021-07-11 ENCOUNTER — Other Ambulatory Visit: Payer: Self-pay

## 2021-07-11 ENCOUNTER — Encounter (HOSPITAL_BASED_OUTPATIENT_CLINIC_OR_DEPARTMENT_OTHER): Payer: Self-pay | Admitting: Orthopaedic Surgery

## 2021-07-11 DIAGNOSIS — Z820 Family history of epilepsy and other diseases of the nervous system: Secondary | ICD-10-CM | POA: Insufficient documentation

## 2021-07-11 DIAGNOSIS — Z825 Family history of asthma and other chronic lower respiratory diseases: Secondary | ICD-10-CM | POA: Insufficient documentation

## 2021-07-11 DIAGNOSIS — Z888 Allergy status to other drugs, medicaments and biological substances status: Secondary | ICD-10-CM | POA: Diagnosis not present

## 2021-07-11 DIAGNOSIS — Z885 Allergy status to narcotic agent status: Secondary | ICD-10-CM | POA: Diagnosis not present

## 2021-07-11 DIAGNOSIS — Z881 Allergy status to other antibiotic agents status: Secondary | ICD-10-CM | POA: Diagnosis not present

## 2021-07-11 DIAGNOSIS — Z884 Allergy status to anesthetic agent status: Secondary | ICD-10-CM | POA: Insufficient documentation

## 2021-07-11 DIAGNOSIS — Z8249 Family history of ischemic heart disease and other diseases of the circulatory system: Secondary | ICD-10-CM | POA: Insufficient documentation

## 2021-07-11 DIAGNOSIS — Z91010 Allergy to peanuts: Secondary | ICD-10-CM | POA: Insufficient documentation

## 2021-07-11 DIAGNOSIS — Z8049 Family history of malignant neoplasm of other genital organs: Secondary | ICD-10-CM | POA: Insufficient documentation

## 2021-07-11 DIAGNOSIS — Z87891 Personal history of nicotine dependence: Secondary | ICD-10-CM | POA: Diagnosis not present

## 2021-07-11 DIAGNOSIS — M7541 Impingement syndrome of right shoulder: Secondary | ICD-10-CM | POA: Insufficient documentation

## 2021-07-11 DIAGNOSIS — M7501 Adhesive capsulitis of right shoulder: Secondary | ICD-10-CM | POA: Insufficient documentation

## 2021-07-11 DIAGNOSIS — Z79899 Other long term (current) drug therapy: Secondary | ICD-10-CM | POA: Insufficient documentation

## 2021-07-11 DIAGNOSIS — Z832 Family history of diseases of the blood and blood-forming organs and certain disorders involving the immune mechanism: Secondary | ICD-10-CM | POA: Insufficient documentation

## 2021-07-11 DIAGNOSIS — Z91013 Allergy to seafood: Secondary | ICD-10-CM | POA: Insufficient documentation

## 2021-07-11 DIAGNOSIS — Y9389 Activity, other specified: Secondary | ICD-10-CM | POA: Insufficient documentation

## 2021-07-11 HISTORY — DX: Nausea with vomiting, unspecified: R11.2

## 2021-07-11 HISTORY — PX: SHOULDER ARTHROSCOPY WITH SUBACROMIAL DECOMPRESSION: SHX5684

## 2021-07-11 HISTORY — DX: Nausea with vomiting, unspecified: Z98.890

## 2021-07-11 LAB — POCT PREGNANCY, URINE: Preg Test, Ur: NEGATIVE

## 2021-07-11 SURGERY — SHOULDER ARTHROSCOPY WITH SUBACROMIAL DECOMPRESSION
Anesthesia: General | Site: Shoulder | Laterality: Right

## 2021-07-11 MED ORDER — ACETAMINOPHEN 500 MG PO TABS
ORAL_TABLET | ORAL | Status: AC
  Filled 2021-07-11: qty 2

## 2021-07-11 MED ORDER — BUPIVACAINE HCL (PF) 0.5 % IJ SOLN
INTRAMUSCULAR | Status: DC | PRN
  Administered 2021-07-11: 15 mL via PERINEURAL

## 2021-07-11 MED ORDER — ONDANSETRON HCL 4 MG/2ML IJ SOLN
INTRAMUSCULAR | Status: AC
  Filled 2021-07-11: qty 2

## 2021-07-11 MED ORDER — MIDAZOLAM HCL 2 MG/2ML IJ SOLN
INTRAMUSCULAR | Status: AC
  Filled 2021-07-11: qty 2

## 2021-07-11 MED ORDER — DEXAMETHASONE SODIUM PHOSPHATE 4 MG/ML IJ SOLN
INTRAMUSCULAR | Status: DC | PRN
  Administered 2021-07-11: 8 mg via INTRAVENOUS

## 2021-07-11 MED ORDER — AMISULPRIDE (ANTIEMETIC) 5 MG/2ML IV SOLN: 10.0000 mg | Freq: Once | INTRAVENOUS | Status: DC | PRN

## 2021-07-11 MED ORDER — SODIUM CHLORIDE 0.9 % IR SOLN
Status: DC | PRN
  Administered 2021-07-11: 3000 mL

## 2021-07-11 MED ORDER — MIDAZOLAM HCL 2 MG/2ML IJ SOLN
2.0000 mg | Freq: Once | INTRAMUSCULAR | Status: AC
  Administered 2021-07-11: 2 mg via INTRAVENOUS

## 2021-07-11 MED ORDER — PHENYLEPHRINE HCL (PRESSORS) 10 MG/ML IV SOLN
INTRAVENOUS | Status: DC | PRN
  Administered 2021-07-11 (×3): 80 ug via INTRAVENOUS
  Administered 2021-07-11: 40 ug via INTRAVENOUS

## 2021-07-11 MED ORDER — PROPOFOL 500 MG/50ML IV EMUL
INTRAVENOUS | Status: DC | PRN
  Administered 2021-07-11: 50 ug/kg/min via INTRAVENOUS

## 2021-07-11 MED ORDER — CEFAZOLIN SODIUM-DEXTROSE 2-4 GM/100ML-% IV SOLN
2.0000 g | INTRAVENOUS | Status: AC
  Administered 2021-07-11: 2 g via INTRAVENOUS

## 2021-07-11 MED ORDER — ROCURONIUM BROMIDE 100 MG/10ML IV SOLN
INTRAVENOUS | Status: DC | PRN
  Administered 2021-07-11: 80 mg via INTRAVENOUS

## 2021-07-11 MED ORDER — FENTANYL CITRATE (PF) 100 MCG/2ML IJ SOLN
INTRAMUSCULAR | Status: AC
  Filled 2021-07-11: qty 2

## 2021-07-11 MED ORDER — PROPOFOL 10 MG/ML IV BOLUS
INTRAVENOUS | Status: DC | PRN
  Administered 2021-07-11: 200 mg via INTRAVENOUS

## 2021-07-11 MED ORDER — LIDOCAINE HCL (PF) 2 % IJ SOLN
INTRAMUSCULAR | Status: DC | PRN
  Administered 2021-07-11: 60 mg via INTRADERMAL

## 2021-07-11 MED ORDER — PHENYLEPHRINE 40 MCG/ML (10ML) SYRINGE FOR IV PUSH (FOR BLOOD PRESSURE SUPPORT)
PREFILLED_SYRINGE | INTRAVENOUS | Status: AC
  Filled 2021-07-11: qty 10

## 2021-07-11 MED ORDER — CEFAZOLIN SODIUM-DEXTROSE 2-4 GM/100ML-% IV SOLN
INTRAVENOUS | Status: AC
  Filled 2021-07-11: qty 100

## 2021-07-11 MED ORDER — FENTANYL CITRATE (PF) 100 MCG/2ML IJ SOLN
INTRAMUSCULAR | Status: DC | PRN
  Administered 2021-07-11: 50 ug via INTRAVENOUS

## 2021-07-11 MED ORDER — PROPOFOL 10 MG/ML IV BOLUS
INTRAVENOUS | Status: AC
  Filled 2021-07-11: qty 20

## 2021-07-11 MED ORDER — DEXAMETHASONE SODIUM PHOSPHATE 10 MG/ML IJ SOLN
INTRAMUSCULAR | Status: AC
  Filled 2021-07-11: qty 1

## 2021-07-11 MED ORDER — HYDROCODONE-ACETAMINOPHEN 5-325 MG PO TABS: 1.0000 | ORAL_TABLET | Freq: Four times a day (QID) | ORAL | 0 refills | Status: DC | PRN

## 2021-07-11 MED ORDER — FENTANYL CITRATE (PF) 100 MCG/2ML IJ SOLN
100.0000 ug | Freq: Once | INTRAMUSCULAR | Status: AC
  Administered 2021-07-11: 100 ug via INTRAVENOUS

## 2021-07-11 MED ORDER — SUGAMMADEX SODIUM 500 MG/5ML IV SOLN
INTRAVENOUS | Status: DC | PRN
  Administered 2021-07-11: 400 mg via INTRAVENOUS

## 2021-07-11 MED ORDER — BUPIVACAINE HCL (PF) 0.25 % IJ SOLN
INTRAMUSCULAR | Status: AC
  Filled 2021-07-11: qty 30

## 2021-07-11 MED ORDER — SCOPOLAMINE 1 MG/3DAYS TD PT72
MEDICATED_PATCH | TRANSDERMAL | Status: AC
  Filled 2021-07-11: qty 1

## 2021-07-11 MED ORDER — SUGAMMADEX SODIUM 500 MG/5ML IV SOLN
INTRAVENOUS | Status: AC
  Filled 2021-07-11: qty 5

## 2021-07-11 MED ORDER — FENTANYL CITRATE (PF) 100 MCG/2ML IJ SOLN: 25.0000 ug | INTRAMUSCULAR | Status: DC | PRN

## 2021-07-11 MED ORDER — LIDOCAINE HCL (PF) 2 % IJ SOLN
INTRAMUSCULAR | Status: AC
  Filled 2021-07-11: qty 5

## 2021-07-11 MED ORDER — ACETAMINOPHEN 500 MG PO TABS
1000.0000 mg | ORAL_TABLET | Freq: Once | ORAL | Status: AC
  Administered 2021-07-11: 1000 mg via ORAL

## 2021-07-11 MED ORDER — BUPIVACAINE LIPOSOME 1.3 % IJ SUSP
INTRAMUSCULAR | Status: DC | PRN
  Administered 2021-07-11: 10 mL via PERINEURAL

## 2021-07-11 MED ORDER — LACTATED RINGERS IV SOLN: INTRAVENOUS | Status: DC

## 2021-07-11 MED ORDER — ONDANSETRON HCL 4 MG/2ML IJ SOLN
INTRAMUSCULAR | Status: DC | PRN
  Administered 2021-07-11: 4 mg via INTRAVENOUS

## 2021-07-11 MED ORDER — PROMETHAZINE HCL 25 MG/ML IJ SOLN: 6.2500 mg | INTRAMUSCULAR | Status: DC | PRN

## 2021-07-11 MED ORDER — SCOPOLAMINE 1 MG/3DAYS TD PT72
1.0000 | MEDICATED_PATCH | TRANSDERMAL | Status: DC
  Administered 2021-07-11: 1.5 mg via TRANSDERMAL

## 2021-07-11 SURGICAL SUPPLY — 55 items
AID PSTN UNV HD RSTRNT DISP (MISCELLANEOUS) ×1
BLADE SURG 15 STRL LF DISP TIS (BLADE) IMPLANT
BLADE SURG 15 STRL SS (BLADE)
BURR OVAL 8 FLU 4.0X13 (MISCELLANEOUS) ×2 IMPLANT
BURR OVAL 8 FLU 5.0X13 (MISCELLANEOUS) IMPLANT
CANNULA TWIST IN 8.25X7CM (CANNULA) IMPLANT
DECANTER SPIKE VIAL GLASS SM (MISCELLANEOUS) IMPLANT
DISSECTOR  3.8MM X 13CM (MISCELLANEOUS) ×1
DISSECTOR 3.8MM X 13CM (MISCELLANEOUS) ×1 IMPLANT
DISSECTOR 4.0MM X 13CM (MISCELLANEOUS) ×2 IMPLANT
DRAPE IMP U-DRAPE 54X76 (DRAPES) ×2 IMPLANT
DRAPE SHOULDER BEACH CHAIR (DRAPES) ×2 IMPLANT
DRAPE U-SHAPE 47X51 STRL (DRAPES) ×2 IMPLANT
DRSG PAD ABDOMINAL 8X10 ST (GAUZE/BANDAGES/DRESSINGS) ×2 IMPLANT
DURAPREP 26ML APPLICATOR (WOUND CARE) ×2 IMPLANT
ELECT REM PT RETURN 9FT ADLT (ELECTROSURGICAL)
ELECTRODE REM PT RTRN 9FT ADLT (ELECTROSURGICAL) IMPLANT
GAUZE SPONGE 4X4 12PLY STRL (GAUZE/BANDAGES/DRESSINGS) ×2 IMPLANT
GAUZE XEROFORM 1X8 LF (GAUZE/BANDAGES/DRESSINGS) ×2 IMPLANT
GLOVE SRG 8 PF TXTR STRL LF DI (GLOVE) ×2 IMPLANT
GLOVE SURG ENC MOIS LTX SZ7.5 (GLOVE) ×2 IMPLANT
GLOVE SURG ORTHO LTX SZ8 (GLOVE) ×2 IMPLANT
GLOVE SURG UNDER POLY LF SZ8 (GLOVE) ×4
GOWN STRL REUS W/ TWL LRG LVL3 (GOWN DISPOSABLE) ×1 IMPLANT
GOWN STRL REUS W/ TWL XL LVL3 (GOWN DISPOSABLE) ×1 IMPLANT
GOWN STRL REUS W/TWL LRG LVL3 (GOWN DISPOSABLE) ×2
GOWN STRL REUS W/TWL XL LVL3 (GOWN DISPOSABLE) ×2
KIT SHOULDER TRACTION (DRAPES) ×2 IMPLANT
MANIFOLD NEPTUNE II (INSTRUMENTS) ×2 IMPLANT
NDL SAFETY ECLIPSE 18X1.5 (NEEDLE) IMPLANT
NEEDLE HYPO 18GX1.5 SHARP (NEEDLE)
NEEDLE SCORPION MULTI FIRE (NEEDLE) IMPLANT
NS IRRIG 1000ML POUR BTL (IV SOLUTION) IMPLANT
PACK ARTHROSCOPY DSU (CUSTOM PROCEDURE TRAY) ×2 IMPLANT
PACK BASIN DAY SURGERY FS (CUSTOM PROCEDURE TRAY) ×2 IMPLANT
PAD ORTHO SHOULDER 7X19 LRG (SOFTGOODS) IMPLANT
PENCIL SMOKE EVACUATOR (MISCELLANEOUS) IMPLANT
PORT APPOLLO RF 90DEGREE MULTI (SURGICAL WAND) ×2 IMPLANT
RESTRAINT HEAD UNIVERSAL NS (MISCELLANEOUS) ×2 IMPLANT
SLING ARM FOAM STRAP LRG (SOFTGOODS) ×2 IMPLANT
SLING ULTRA II MEDIUM (SOFTGOODS) IMPLANT
SPONGE T-LAP 4X18 ~~LOC~~+RFID (SPONGE) IMPLANT
SUCTION FRAZIER HANDLE 10FR (MISCELLANEOUS)
SUCTION TUBE FRAZIER 10FR DISP (MISCELLANEOUS) IMPLANT
SUT ETHIBOND 2 OS 4 DA (SUTURE) IMPLANT
SUT ETHILON 3 0 PS 1 (SUTURE) ×2 IMPLANT
SUT VIC AB 2-0 SH 27 (SUTURE)
SUT VIC AB 2-0 SH 27XBRD (SUTURE) IMPLANT
SYR 20ML LL LF (SYRINGE) IMPLANT
SYR BULB EAR ULCER 3OZ GRN STR (SYRINGE) IMPLANT
TAPE HYPAFIX 6X30 (GAUZE/BANDAGES/DRESSINGS) ×2 IMPLANT
TOWEL GREEN STERILE FF (TOWEL DISPOSABLE) ×2 IMPLANT
TUBE CONNECTING 20X1/4 (TUBING) ×2 IMPLANT
TUBING ARTHROSCOPY IRRIG 16FT (MISCELLANEOUS) ×2 IMPLANT
WATER STERILE IRR 1000ML POUR (IV SOLUTION) ×2 IMPLANT

## 2021-07-11 NOTE — Transfer of Care (Signed)
Immediate Anesthesia Transfer of Care Note  Patient: Marielis Samara  Procedure(s) Performed: RIGHT SHOULDER ARTHROSCOPY WITH EXTENSIVE DEBRIDEMENT AND SUBACROMIAL DECOMPRESSION (Right: Shoulder)  Patient Location: PACU  Anesthesia Type:GA combined with regional for post-op pain  Level of Consciousness: awake, alert  and oriented  Airway & Oxygen Therapy: Patient Spontanous Breathing and Patient connected to face mask oxygen  Post-op Assessment: Report given to RN and Post -op Vital signs reviewed and stable  Post vital signs: Reviewed and stable  Last Vitals:  Vitals Value Taken Time  BP 130/82 07/11/21 1152  Temp    Pulse 89 07/11/21 1156  Resp 13 07/11/21 1156  SpO2 100 % 07/11/21 1156  Vitals shown include unvalidated device data.  Last Pain:  Vitals:   07/11/21 0833  TempSrc: Oral  PainSc: 2       Patients Stated Pain Goal: 3 (07/11/21 8938)  Complications: No notable events documented.
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
## 2021-07-11 NOTE — Anesthesia Preprocedure Evaluation (Addendum)
Anesthesia Evaluation  Patient identified by MRN, date of birth, ID band Patient awake    Reviewed: Allergy & Precautions, NPO status , Patient's Chart, lab work & pertinent test results  History of Anesthesia Complications (+) PONV and history of anesthetic complications  Airway Mallampati: II  TM Distance: >3 FB Neck ROM: Full    Dental no notable dental hx.    Pulmonary neg pulmonary ROS, former smoker,    Pulmonary exam normal breath sounds clear to auscultation       Cardiovascular negative cardio ROS Normal cardiovascular exam Rhythm:Regular Rate:Normal     Neuro/Psych PSYCHIATRIC DISORDERS Anxiety Depression negative neurological ROS     GI/Hepatic negative GI ROS, Neg liver ROS,   Endo/Other  negative endocrine ROS  Renal/GU negative Renal ROS  negative genitourinary   Musculoskeletal  (+) Arthritis ,   Abdominal   Peds negative pediatric ROS (+)  Hematology negative hematology ROS (+)   Anesthesia Other Findings   Reproductive/Obstetrics negative OB ROS                            Anesthesia Physical Anesthesia Plan  ASA: 2  Anesthesia Plan: General   Post-op Pain Management: GA combined w/ Regional for post-op pain   Induction: Intravenous  PONV Risk Score and Plan: 3 and Treatment may vary due to age or medical condition, Midazolam, Scopolamine patch - Pre-op, Ondansetron and Dexamethasone  Airway Management Planned: Oral ETT  Additional Equipment:   Intra-op Plan:   Post-operative Plan: Extubation in OR  Informed Consent:   Plan Discussed with: CRNA and Anesthesiologist  Anesthesia Plan Comments:         Anesthesia Quick Evaluation

## 2021-07-11 NOTE — H&P (Signed)
Donna Wallace is an 30 y.o. female.   Chief Complaint: Decreased right shoulder motion and pain HPI: The patient is a 30 year old female who did sustain some trauma to her right shoulder more recently.  Over time due to pain with the right shoulder she developed adhesive capsulitis with limited motion of the shoulder and pain.  She has tried steroid injections and anti-inflammatories as well as pushing herself the range of motion of that shoulder.  Has been through therapy and recent MRI of the shoulder showed no tearing of the rotator cuff which is some mild tendinosis but thickening of the anterior capsule and axillary recess consistent with adhesive capsulitis.  At this point we recommended a surgical intervention given the failure of conservative treatment for her right shoulder.  Past Medical History:  Diagnosis Date   Anxiety    Mental disorder    anxiety and depression   Panic attacks    PONV (postoperative nausea and vomiting)    Renal disorder     Past Surgical History:  Procedure Laterality Date   CHOLECYSTECTOMY     MYRINGOTOMY      Family History  Problem Relation Age of Onset   Hypertension Mother    Cervical cancer Mother    COPD Mother    Asthma Mother    Migraines Mother    Hypertension Maternal Grandmother    Bipolar disorder Maternal Grandmother    Schizophrenia Maternal Grandmother    Dementia Maternal Grandmother    Hypertension Maternal Grandfather    Clotting disorder Maternal Grandfather        blood clots in legs   Heart disease Maternal Grandfather    Cervical cancer Paternal Grandmother    Glaucoma Father    Social History:  reports that she quit smoking about 6 years ago. Her smoking use included cigarettes. She has never used smokeless tobacco. She reports current alcohol use. She reports that she does not use drugs.  Allergies:  Allergies  Allergen Reactions   Hydromorphone Hives, Swelling and Other (See Comments)   Peanut Oil Anaphylaxis,  Itching and Rash   Peanut-Containing Drug Products Anaphylaxis, Itching and Rash   Dilaudid [Hydromorphone Hcl] Hives, Itching and Swelling   Fish Allergy Hives   Lidocaine     Lidocaine patch; has had local anes (incl.lidocaine) injections and sprays with no issues   Metronidazole Other (See Comments)    Blurred vision, dizziness. Patient states not allergic to Metrogel.    Zithromax [Azithromycin Dihydrate] Nausea And Vomiting and Other (See Comments)    REACTION: Light headedness    Medications Prior to Admission  Medication Sig Dispense Refill   ferrous sulfate 324 MG TBEC Take 324 mg by mouth.     norethindrone-ethinyl estradiol-FE (LARIN FE 1/20) 1-20 MG-MCG tablet Take 1 tablet by mouth daily. 28 tablet 1   buPROPion (WELLBUTRIN XL) 150 MG 24 hr tablet Take 1 tablet by mouth every morning.     sertraline (ZOLOFT) 100 MG tablet Take 150 mg by mouth daily.      Results for orders placed or performed during the hospital encounter of 07/11/21 (from the past 48 hour(s))  Pregnancy, urine POC     Status: None   Collection Time: 07/11/21  8:12 AM  Result Value Ref Range   Preg Test, Ur NEGATIVE NEGATIVE    Comment:        THE SENSITIVITY OF THIS METHODOLOGY IS >24 mIU/mL    No results found.  Review of Systems  All other  systems reviewed and are negative.  Blood pressure 124/89, pulse 67, temperature 98.1 F (36.7 C), temperature source Oral, resp. rate 13, height 5\' 3"  (1.6 m), weight 83.4 kg, last menstrual period 06/25/2021, SpO2 99 %. Physical Exam Vitals reviewed.  Constitutional:      Appearance: Normal appearance.  HENT:     Head: Normocephalic and atraumatic.  Eyes:     Extraocular Movements: Extraocular movements intact.     Pupils: Pupils are equal, round, and reactive to light.  Cardiovascular:     Rate and Rhythm: Normal rate and regular rhythm.  Pulmonary:     Effort: Pulmonary effort is normal.     Breath sounds: Normal breath sounds.  Abdominal:      Palpations: Abdomen is soft.  Musculoskeletal:     Right shoulder: Tenderness present. Decreased range of motion. Decreased strength.     Cervical back: Normal range of motion and neck supple.  Neurological:     Mental Status: She is alert and oriented to person, place, and time.  Psychiatric:        Behavior: Behavior normal.     Assessment/Plan Right shoulder adhesive capsulitis  Our plan is to proceed to surgery today for right shoulder manipulation under anesthesia as well as an arthroscopy with debridement and subacromial decompression.  The risks and benefits of surgery been explained in detail and informed consent is obtained.  The right shoulder has been marked.  06/27/2021, MD 07/11/2021, 10:09 AM

## 2021-07-11 NOTE — Progress Notes (Signed)
Assisted Dr. Bass with left, ultrasound guided, interscalene  block. Side rails up, monitors on throughout procedure. See vital signs in flow sheet. Tolerated Procedure well.  

## 2021-07-11 NOTE — Brief Op Note (Signed)
07/11/2021  11:50 AM  PATIENT:  Donna Wallace  30 y.o. female  PRE-OPERATIVE DIAGNOSIS:  Right shoulder arthrofibrosis  POST-OPERATIVE DIAGNOSIS:  Right shoulder impingement  PROCEDURE:  Procedure(s): RIGHT SHOULDER ARTHROSCOPY WITH EXTENSIVE DEBRIDEMENT AND SUBACROMIAL DECOMPRESSION (Right)  SURGEON:  Surgeon(s) and Role:    Kathryne Hitch, MD - Primary  ANESTHESIA:   regional and general  EBL:  10 mL   COUNTS:  YES  DICTATION: .Other Dictation: Dictation Number [id number]  PLAN OF CARE: Discharge to home after PACU  PATIENT DISPOSITION:  PACU - hemodynamically stable.   Delay start of Pharmacological VTE agent (>24hrs) due to surgical blood loss or risk of bleeding: no

## 2021-07-11 NOTE — Discharge Instructions (Addendum)
Increase your activities as comfort allows. You may come in and out of the sling for comfort. Do work on trying to move your right shoulder daily and multiple times a day. Do expect some swelling -ice periodically as needed. You can remove all your dressings tomorrow and get the 2 incisions wet in the shower daily. Apply small Band-Aids over the incisions daily after each shower.  Regional Anesthesia Blocks  1. Numbness or the inability to move the "blocked" extremity may last from 3-48 hours after placement. The length of time depends on the medication injected and your individual response to the medication. If the numbness is not going away after 48 hours, call your surgeon.  2. The extremity that is blocked will need to be protected until the numbness is gone and the  Strength has returned. Because you cannot feel it, you will need to take extra care to avoid injury. Because it may be weak, you may have difficulty moving it or using it. You may not know what position it is in without looking at it while the block is in effect.  3. For blocks in the legs and feet, returning to weight bearing and walking needs to be done carefully. You will need to wait until the numbness is entirely gone and the strength has returned. You should be able to move your leg and foot normally before you try and bear weight or walk. You will need someone to be with you when you first try to ensure you do not fall and possibly risk injury.  4. Bruising and tenderness at the needle site are common side effects and will resolve in a few days.  5. Persistent numbness or new problems with movement should be communicated to the surgeon or the [facility] [phone] [facility] [phone]).    Post Anesthesia Home Care Instructions  Activity: Get plenty of rest for the remainder of the day. A responsible individual must stay with you for 24 hours following the procedure.  For the next 24  hours, DO NOT: -Drive a car -Advertising copywriter -Drink alcoholic beverages -Take any medication unless instructed by your physician -Make any legal decisions or sign important papers.  Meals: Start with liquid foods such as gelatin or soup. Progress to regular foods as tolerated. Avoid greasy, spicy, heavy foods. If nausea and/or vomiting occur, drink only clear liquids until the nausea and/or vomiting subsides. Call your physician if vomiting continues.  Special Instructions/Symptoms: Your throat may feel dry or sore from the anesthesia or the breathing tube placed in your throat during surgery. If this causes discomfort, gargle with warm salt water. The discomfort should disappear within 24 hours.  If you had a scopolamine patch placed behind your ear for the management of post- operative nausea and/or vomiting:  1. The medication in the patch is effective for 72 hours, after which it should be removed.  Wrap patch in a tissue and discard in the trash. Wash hands thoroughly with soap and water. 2. You may remove the patch earlier than 72 hours if you experience unpleasant side effects which may include dry mouth, dizziness or visual disturbances. 3. Avoid touching the patch. Wash your hands with soap and water after contact with the patch.     Post Anesthesia Home Care Instructions  Activity: Get plenty of rest for the remainder of the day. A responsible individual must stay with you for 24 hours following the procedure.  For the next 24 hours, DO  NOT: -Drive a car Environmental consultant -Drink alcoholic beverages -Take any medication unless instructed by your physician -Make any legal decisions or sign important papers.  Meals: Start with liquid foods such as gelatin or soup. Progress to regular foods as tolerated. Avoid greasy, spicy, heavy foods. If nausea and/or vomiting occur, drink only clear liquids until the nausea and/or vomiting subsides. Call your physician if vomiting  continues.  Special Instructions/Symptoms: Your throat may feel dry or sore from the anesthesia or the breathing tube placed in your throat during surgery. If this causes discomfort, gargle with warm salt water. The discomfort should disappear within 24 hours.  If you had a scopolamine patch placed behind your ear for the management of post- operative nausea and/or vomiting:  1. The medication in the patch is effective for 72 hours, after which it should be removed.  Wrap patch in a tissue and discard in the trash. Wash hands thoroughly with soap and water. 2. You may remove the patch earlier than 72 hours if you experience unpleasant side effects which may include dry mouth, dizziness or visual disturbances. 3. Avoid touching the patch. Wash your hands with soap and water after contact with the patch.     I Post Anesthesia Home Care Instructions  Activity: Get plenty of rest for the remainder of the day. A responsible individual must stay with you for 24 hours following the procedure.  For the next 24 hours, DO NOT: -Drive a car -Advertising copywriter -Drink alcoholic beverages -Take any medication unless instructed by your physician -Make any legal decisions or sign important papers.  Meals: Start with liquid foods such as gelatin or soup. Progress to regular foods as tolerated. Avoid greasy, spicy, heavy foods. If nausea and/or vomiting occur, drink only clear liquids until the nausea and/or vomiting subsides. Call your physician if vomiting continues.  Special Instructions/Symptoms: Your throat may feel dry or sore from the anesthesia or the breathing tube placed in your throat during surgery. If this causes discomfort, gargle with warm salt water. The discomfort should disappear within 24 hours.  If you had a scopolamine patch placed behind your ear for the management of post- operative nausea and/or vomiting:  1. The medication in the patch is effective for 72 hours, after which it  should be removed.  Wrap patch in a tissue and discard in the trash. Wash hands thoroughly with soap and water. 2. You may remove the patch earlier than 72 hours if you experience unpleasant side effects which may include dry mouth, dizziness or visual disturbances. 3. Avoid touching the patch. Wash your hands with soap and water after contact with the patch.

## 2021-07-11 NOTE — Anesthesia Procedure Notes (Addendum)
Anesthesia Regional Block: Interscalene brachial plexus block   Pre-Anesthetic Checklist: , timeout performed,  Correct Patient, Correct Site, Correct Laterality,  Correct Procedure, Correct Position, site marked,  Risks and benefits discussed,  Surgical consent,  Pre-op evaluation,  At surgeon's request and post-op pain management  Laterality: Right  Prep: chloraprep       Needles:  Injection technique: Single-shot  Needle Type: Echogenic Stimulator Needle     Needle Length: 5cm  Needle Gauge: 22     Additional Needles:   Narrative:  Start time: 07/11/2021 9:00 AM End time: 07/11/2021 9:05 AM Injection made incrementally with aspirations every 5 mL.  Performed by: Personally  Anesthesiologist: [name], MD  Additional Notes: Functioning IV was confirmed and monitors applied.  A 45mm 22ga echogenic arrow stimulator was used. Sterile prep and drape,hand hygiene and sterile gloves were used.Ultrasound guidance: relevant anatomy identified, needle position confirmed, local anesthetic spread visualized around nerve(s)., vascular puncture avoided.  Image printed for medical record.  Negative aspiration and negative test dose prior to incremental administration of local anesthetic. The patient tolerated the procedure well.

## 2021-07-11 NOTE — Progress Notes (Signed)
Assisted Dr. Donavan Foil with right, ultrasound guided, interscalene  block. Side rails up, monitors on throughout procedure. See vital signs in flow sheet. Tolerated Procedure well. **Previous note incorrectly states "left" arm was blocked. Should read "Right"

## 2021-07-11 NOTE — Anesthesia Postprocedure Evaluation (Signed)
Anesthesia Post Note  Patient: Donna Wallace  Procedure(s) Performed: RIGHT SHOULDER ARTHROSCOPY WITH EXTENSIVE DEBRIDEMENT AND SUBACROMIAL DECOMPRESSION (Right: Shoulder)     Patient location during evaluation: PACU Anesthesia Type: General Level of consciousness: awake Pain management: pain level controlled Vital Signs Assessment: post-procedure vital signs reviewed and stable Respiratory status: spontaneous breathing and respiratory function stable Cardiovascular status: stable Postop Assessment: no apparent nausea or vomiting Anesthetic complications: no   No notable events documented.  Last Vitals:  Vitals:   07/11/21 1215 07/11/21 1230  BP: 128/85 130/80  Pulse: 87 78  Resp: 17 20  Temp:  37.1 C  SpO2: 98% 97%    Last Pain:  Vitals:   07/11/21 1230  TempSrc: Oral  PainSc: 0-No pain                 [name]

## 2021-07-11 NOTE — Anesthesia Procedure Notes (Signed)
Procedure Name: Intubation Date/Time: 07/11/2021 10:49 AM Performed by: Ezequiel Kayser, CRNA Pre-anesthesia Checklist: Patient identified, Emergency Drugs available, Suction available and Patient being monitored Patient Re-evaluated:Patient Re-evaluated prior to induction Oxygen Delivery Method: Circle System Utilized Preoxygenation: Pre-oxygenation with 100% oxygen Induction Type: IV induction Ventilation: Mask ventilation without difficulty Laryngoscope Size: Mac and 3 Grade View: Grade I Tube type: Oral Tube size: 7.0 mm Number of attempts: 1 Airway Equipment and Method: Stylet and Oral airway Placement Confirmation: ETT inserted through vocal cords under direct vision, positive ETCO2 and breath sounds checked- equal and bilateral Secured at: 21 cm Tube secured with: Tape Dental Injury: Teeth and Oropharynx as per pre-operative assessment

## 2021-07-12 ENCOUNTER — Encounter (HOSPITAL_BASED_OUTPATIENT_CLINIC_OR_DEPARTMENT_OTHER): Payer: Self-pay | Admitting: Orthopaedic Surgery

## 2021-07-15 ENCOUNTER — Telehealth: Payer: Self-pay

## 2021-07-15 NOTE — Telephone Encounter (Signed)
Patient aware of the below message  

## 2021-07-15 NOTE — Telephone Encounter (Signed)
Patient is s/p RIGHT SHOULDER ARTHROSCOPY WITH EXTENSIVE DEBRIDEMENT AND SUBACROMIAL DECOMPRESSION preformed on 07/11/2021. Mcyhart has been sent and now phone call with concerns of numbness and arm weakness. Please advise if this is normal and how long patient can experience these feelings.

## 2021-07-15 NOTE — Telephone Encounter (Signed)
Is this normal ? 

## 2021-07-18 ENCOUNTER — Ambulatory Visit (INDEPENDENT_AMBULATORY_CARE_PROVIDER_SITE_OTHER): Payer: 59 | Admitting: Physician Assistant

## 2021-07-18 ENCOUNTER — Other Ambulatory Visit: Payer: Self-pay

## 2021-07-18 ENCOUNTER — Encounter: Payer: Self-pay | Admitting: Physician Assistant

## 2021-07-18 DIAGNOSIS — Z9889 Other specified postprocedural states: Secondary | ICD-10-CM

## 2021-07-18 NOTE — Progress Notes (Signed)
IFB:PPHKFE is 1 week status post right shoulder arthroscopy.  She states she still has significant pain in the shoulder.  She feels she has less range of motion.  Preoperative diagnosis was shoulder adhesive capsulitis.  Post regional shoulder block and general anesthesia range of motion shoulder was found to be full.  She underwent a minimal debridement of the right shoulder and subacromial decompression.  There is no evidence of rotator cuff tear.  No evidence of adhesive capsulitis.  She is only found to have minimal the inflamed tissues and bursitis in the subacromial outlet region.  Physical exam: Right shoulder port sites are all well approximated with nylon sutures.  No signs of infection.  She has limited range of motion of the right shoulder secondary to pain.   Impression: Status post right shoulder arthroscopy 07/11/2021  Plan: We will send her to formal physical therapy she is given a prescription she has a therapist that she wants to work with.  She will work on range of motion strengthening shoulder.  Discussed Codman pendulum and wall crawls and forward flexion exercises with her.  Sling for comfort.  Sutures removed.  She should refrain from submerging the shoulder in water until the incisions are completely healed but can get them wet in the shower.  Questions were encouraged and answered at length

## 2021-07-23 ENCOUNTER — Other Ambulatory Visit (HOSPITAL_COMMUNITY)
Admission: RE | Admit: 2021-07-23 | Discharge: 2021-07-23 | Disposition: A | Discharge status: Home / Self Care | Payer: 59 | Source: Ambulatory Visit | Attending: Certified Nurse Midwife | Admitting: Certified Nurse Midwife

## 2021-07-23 ENCOUNTER — Ambulatory Visit (INDEPENDENT_AMBULATORY_CARE_PROVIDER_SITE_OTHER): Payer: 59 | Admitting: Certified Nurse Midwife

## 2021-07-23 ENCOUNTER — Other Ambulatory Visit: Payer: Self-pay

## 2021-07-23 ENCOUNTER — Encounter: Payer: Self-pay | Admitting: Certified Nurse Midwife

## 2021-07-23 VITALS — BP 130/88 | HR 92 | Ht 63.0 in | Wt 182.4 lb

## 2021-07-23 DIAGNOSIS — Z01419 Encounter for gynecological examination (general) (routine) without abnormal findings: Secondary | ICD-10-CM

## 2021-07-23 DIAGNOSIS — N898 Other specified noninflammatory disorders of vagina: Secondary | ICD-10-CM | POA: Insufficient documentation

## 2021-07-23 DIAGNOSIS — Z124 Encounter for screening for malignant neoplasm of cervix: Secondary | ICD-10-CM

## 2021-07-23 DIAGNOSIS — Z13220 Encounter for screening for lipoid disorders: Secondary | ICD-10-CM | POA: Diagnosis not present

## 2021-07-23 MED ORDER — NORETHIN ACE-ETH ESTRAD-FE 1-20 MG-MCG PO TABS: 1.0000 | ORAL_TABLET | Freq: Every day | ORAL | 11 refills | Status: DC

## 2021-07-23 NOTE — Progress Notes (Signed)
GYNECOLOGY ANNUAL PREVENTATIVE CARE ENCOUNTER NOTE  History:     Donna Wallace is a 30 y.o. G20P0030 female here for a routine annual gynecologic exam.  Current complaints: vaginal discharge with odor.   Denies abnormal vaginal bleeding, discharge, pelvic pain, problems with intercourse or other gynecologic concerns.     Social Relationship: married  Living: with spouse  and a friend of her husband is staying with them temp.  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 Contraception: OCP (estrogen/progesterone) Last Pap: health department last year. Results were: normal  Last mammogram:  N/a    Upstream - 07/23/21 1409       Pregnancy Intention Screening   Does the patient want to become pregnant in the next year? Unsure    Does the patient's partner want to become pregnant in the next year? Unsure            The pregnancy intention screening data noted above was reviewed. Potential methods of contraception were discussed. The patient elected to proceed with OCP.   Obstetric History OB History  Gravida Para Term Preterm AB Living  3       3    SAB IAB Ectopic Multiple Live Births  3            # Outcome Date GA Lbr Len/2nd Weight Sex Delivery Anes PTL Lv  3 SAB           2 SAB           1 SAB             Past Medical History:  Diagnosis Date   Anxiety    Mental disorder    anxiety and depression   Panic attacks    PONV (postoperative nausea and vomiting)    Renal disorder     Past Surgical History:  Procedure Laterality Date   CHOLECYSTECTOMY     MYRINGOTOMY     SHOULDER ARTHROSCOPY WITH SUBACROMIAL DECOMPRESSION Right 07/11/2021   Procedure: RIGHT SHOULDER ARTHROSCOPY WITH EXTENSIVE DEBRIDEMENT AND SUBACROMIAL DECOMPRESSION;  Surgeon: Kathryne Hitch, MD;  Location:  [facility];  Service: Orthopedics;  Laterality: Right;   SHOULDER SURGERY      Current Outpatient Medications on File Prior to Visit  Medication Sig Dispense Refill   buPROPion (WELLBUTRIN XL) 150 MG 24 hr tablet Take 1 tablet by mouth every morning.     ferrous sulfate 324 MG TBEC Take 324 mg by mouth.     HYDROcodone-acetaminophen (NORCO/VICODIN) 5-325 MG tablet Take 1-2 tablets by mouth every 6 (six) hours as needed for moderate pain. 30 tablet 0   norethindrone-ethinyl estradiol-FE (LARIN FE 1/20) 1-20 MG-MCG tablet Take 1 tablet by mouth daily. 28 tablet 1   sertraline (ZOLOFT) 100 MG tablet Take 150 mg by mouth daily.     No current facility-administered medications on file prior to visit.    Allergies  Allergen Reactions   Hydromorphone Hives, Swelling and Other (See Comments)   Peanut Oil Anaphylaxis, Itching and Rash   Peanut-Containing Drug Products Anaphylaxis, Itching and Rash   Dilaudid [Hydromorphone Hcl] Hives, Itching and Swelling   Fish Allergy Hives   Lidocaine     Lidocaine patch; has had local anes (incl.lidocaine) injections and sprays with no issues   Metronidazole Other (See Comments)    Blurred vision,  dizziness. Patient states not allergic to Metrogel.    Zithromax [Azithromycin Dihydrate] Nausea And Vomiting and Other (See Comments)    REACTION: Light headedness    Social History:  reports that she quit smoking about 6 years ago. Her smoking use included cigarettes. She has never used smokeless tobacco. She reports current alcohol use. She reports that she does not use drugs.  Family History  Problem Relation Age of Onset   Hypertension Mother    Cervical cancer Mother    COPD Mother    Asthma Mother    Migraines Mother    Hypertension Maternal Grandmother    Bipolar disorder Maternal Grandmother    Schizophrenia Maternal Grandmother    Dementia Maternal Grandmother    Hypertension Maternal Grandfather    Clotting disorder Maternal  Grandfather        blood clots in legs   Heart disease Maternal Grandfather    Cervical cancer Paternal Grandmother    Glaucoma Father     The following portions of the patient's history were reviewed and updated as appropriate: allergies, current medications, past family history, past medical history, past social history, past surgical history and problem list.  Review of Systems Pertinent items noted in HPI and remainder of comprehensive ROS otherwise negative.  Physical Exam:  BP (!) 146/99   Pulse 92   Ht 5\' 3"  (1.6 m)   Wt 182 lb 6.4 oz (82.7 kg)   LMP 06/25/2021 (Exact Date)   BMI 32.31 kg/m  CONSTITUTIONAL: Well-developed, well-nourished female in no acute distress.  HENT:  Normocephalic, atraumatic, External right and left ear normal. Oropharynx is clear and moist EYES: Conjunctivae and EOM are normal. Pupils are equal, round, and reactive to light. No scleral icterus.  NECK: Normal range of motion, supple, no masses.  Normal thyroid.  SKIN: Skin is warm and dry. No rash noted. Not diaphoretic. No erythema. No pallor. MUSCULOSKELETAL: Normal range of motion. No tenderness.  No cyanosis, clubbing, or edema.  2+ distal pulses. NEUROLOGIC: Alert and oriented to person, place, and time. Normal reflexes, muscle tone coordination.  PSYCHIATRIC: Normal mood and affect. Normal behavior. Normal judgment and thought content. CARDIOVASCULAR: Normal heart rate noted, regular rhythm RESPIRATORY: Clear to auscultation bilaterally. Effort and breath sounds normal, no problems with respiration noted. BREASTS: Symmetric in size. No masses, tenderness, skin changes, nipple drainage, or lymphadenopathy bilaterally.  ABDOMEN: Soft, no distention noted.  No tenderness, rebound or guarding.  PELVIC: Normal appearing external genitalia and urethral meatus; normal appearing vaginal mucosa and cervix.  No abnormal discharge noted.  Pap smear obtained. Contact bleeding present. Swab collected,  Normal  uterine size, no other palpable masses, no uterine or adnexal tenderness.  .   Assessment and Plan:    1. Well woman exam with routine gynecological exam    Pap:Will follow up results of pap smear and manage accordingly. Mammogram : n/a  Labs: lipid profile Refills: ocp Referral: none Routine preventative health maintenance measures emphasized. Please refer to After Visit Summary for other counseling recommendations.      06/27/2021, CNM [facility],  [facility]

## 2021-07-24 LAB — LIPID PANEL
Chol/HDL Ratio: 5.2 ratio — ABNORMAL HIGH (ref 0.0–4.4)
Cholesterol, Total: 203 mg/dL — ABNORMAL HIGH (ref 100–199)
HDL: 39 mg/dL — ABNORMAL LOW (ref >39)
LDL Chol Calc (NIH): 129 mg/dL — ABNORMAL HIGH (ref 0–99)
Triglycerides: 194 mg/dL — ABNORMAL HIGH (ref 0–149)
VLDL Cholesterol Cal: 35 mg/dL (ref 5–40)

## 2021-07-25 ENCOUNTER — Telehealth: Payer: Self-pay | Admitting: Orthopaedic Surgery

## 2021-07-25 LAB — CERVICOVAGINAL ANCILLARY ONLY
Bacterial Vaginitis (gardnerella): POSITIVE — AB
Candida Glabrata: NEGATIVE
Candida Vaginitis: NEGATIVE
Chlamydia: NEGATIVE
Comment: NEGATIVE
Comment: NEGATIVE
Comment: NEGATIVE
Comment: NEGATIVE
Comment: NEGATIVE
Comment: NORMAL
Neisseria Gonorrhea: NEGATIVE
Trichomonas: NEGATIVE

## 2021-07-25 NOTE — Telephone Encounter (Signed)
Pt called and was unclear on when she need to set her nest post op appt. Please call pt about this matter at [phone].

## 2021-07-26 ENCOUNTER — Other Ambulatory Visit: Payer: Self-pay | Admitting: Certified Nurse Midwife

## 2021-07-26 MED ORDER — CLINDAMYCIN PHOSPHATE 2 % VA CREA: 1.0000 | TOPICAL_CREAM | Freq: Every day | VAGINAL | 0 refills | Status: AC

## 2021-07-26 NOTE — Telephone Encounter (Signed)
In 3 weeks right?

## 2021-07-26 NOTE — Telephone Encounter (Signed)
Called and scheduled pt

## 2021-07-29 LAB — CYTOLOGY - PAP
Comment: NEGATIVE
Diagnosis: NEGATIVE
High risk HPV: NEGATIVE

## 2021-08-07 ENCOUNTER — Encounter: Payer: Self-pay | Admitting: Orthopaedic Surgery

## 2021-08-07 ENCOUNTER — Other Ambulatory Visit: Payer: Self-pay | Admitting: Orthopaedic Surgery

## 2021-08-07 MED ORDER — PREDNISONE 50 MG PO TABS: ORAL_TABLET | ORAL | 0 refills | Status: DC

## 2021-08-07 MED ORDER — HYDROCODONE-ACETAMINOPHEN 5-325 MG PO TABS: 1.0000 | ORAL_TABLET | Freq: Four times a day (QID) | ORAL | 0 refills | Status: DC | PRN

## 2021-08-08 ENCOUNTER — Encounter: Payer: Self-pay | Admitting: Orthopaedic Surgery

## 2021-08-08 ENCOUNTER — Other Ambulatory Visit: Payer: Self-pay

## 2021-08-08 ENCOUNTER — Ambulatory Visit (INDEPENDENT_AMBULATORY_CARE_PROVIDER_SITE_OTHER): Payer: 59 | Admitting: Orthopaedic Surgery

## 2021-08-08 DIAGNOSIS — M25511 Pain in right shoulder: Secondary | ICD-10-CM | POA: Diagnosis not present

## 2021-08-08 MED ORDER — METHYLPREDNISOLONE ACETATE 40 MG/ML IJ SUSP
40.0000 mg | INTRAMUSCULAR | Status: AC | PRN
  Administered 2021-08-08: 40 mg via INTRA_ARTICULAR

## 2021-08-08 MED ORDER — LIDOCAINE HCL 1 % IJ SOLN
3.0000 mL | INTRAMUSCULAR | Status: AC | PRN
  Administered 2021-08-08: 3 mL
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
## 2021-08-15 ENCOUNTER — Ambulatory Visit: Payer: 59 | Admitting: Physician Assistant

## 2021-08-29 ENCOUNTER — Encounter: Payer: Self-pay | Admitting: Orthopaedic Surgery

## 2021-08-29 ENCOUNTER — Ambulatory Visit (INDEPENDENT_AMBULATORY_CARE_PROVIDER_SITE_OTHER): Payer: 59 | Admitting: Orthopaedic Surgery

## 2021-08-29 DIAGNOSIS — Z9889 Other specified postprocedural states: Secondary | ICD-10-CM

## 2021-08-29 NOTE — Progress Notes (Signed)
The patient is a 30 year old who is now around 7 weeks status post a right shoulder manipulation under anesthesia and arthroscopy.  At her last visit I did place a steroid injection subacromial outlet.  She said her shoulder is finally getting better and has just a little bit of pain.  She said the motion is better as well.  Unfortunate recently she injured her right knee when she was using her knee to close a door.  She felt a pop in her knee and has had significant pain in her right knee since then.  Her right operative shoulder is moving much better than he has before.  There is no evidence of arthrofibrosis at this standpoint.  Her right knee shows pain over the superior pole patella and the quad tendon but her extensor mechanism intact.  Her Lachman's and McMurray's exam are negative.  Both her knees hyperextend.  I gave her reassurance that most likely her right knee is just a contusion to the knee and should get better with time.  There is probably a component of a deep knee bruise as well.  If her knee does not get better she will let us know because I would like an MRI of the right knee.  As far as the right shoulder goes, I think she is going eventually to full recovery based on how she looks at this standpoint with her exam of that right shoulder.  All question concerns were answered and addressed.  Follow-up is as needed.

## 2021-09-25 ENCOUNTER — Ambulatory Visit
Admission: EM | Admit: 2021-09-25 | Discharge: 2021-09-25 | Disposition: A | Discharge status: Home / Self Care | Payer: 59 | Attending: Emergency Medicine | Admitting: Emergency Medicine

## 2021-09-25 ENCOUNTER — Encounter: Payer: Self-pay | Admitting: Emergency Medicine

## 2021-09-25 ENCOUNTER — Other Ambulatory Visit: Payer: Self-pay

## 2021-09-25 DIAGNOSIS — R509 Fever, unspecified: Secondary | ICD-10-CM

## 2021-09-25 DIAGNOSIS — B349 Viral infection, unspecified: Secondary | ICD-10-CM | POA: Diagnosis not present

## 2021-09-25 NOTE — Discharge Instructions (Addendum)
Your COVID and Flu tests are pending.  You should self quarantine until the test results are back.    Take Tylenol or ibuprofen as needed for fever or discomfort.  Rest and keep yourself hydrated.    Follow-up with your primary care provider if your symptoms are not improving.     
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
## 2021-09-25 NOTE — ED Triage Notes (Signed)
Pt here with fever of 102.2, sore throat, nasal congestion, cough and body aches since last night after returning from vacation. Requesting Flu test.

## 2021-09-26 LAB — COVID-19, FLU A+B NAA
Influenza A, NAA: DETECTED — AB
Influenza B, NAA: NOT DETECTED
SARS-CoV-2, NAA: NOT DETECTED

## 2021-10-28 ENCOUNTER — Encounter: Payer: Self-pay | Admitting: Physician Assistant

## 2021-10-28 ENCOUNTER — Ambulatory Visit (INDEPENDENT_AMBULATORY_CARE_PROVIDER_SITE_OTHER): Payer: 59

## 2021-10-28 ENCOUNTER — Ambulatory Visit (INDEPENDENT_AMBULATORY_CARE_PROVIDER_SITE_OTHER): Payer: 59 | Admitting: Physician Assistant

## 2021-10-28 ENCOUNTER — Other Ambulatory Visit: Payer: Self-pay

## 2021-10-28 DIAGNOSIS — M79671 Pain in right foot: Secondary | ICD-10-CM
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
## 2021-10-28 NOTE — Addendum Note (Signed)
Addended by: Polly Cobia on: 10/28/2021 11:21 AM   Modules accepted: Orders

## 2021-11-05 ENCOUNTER — Other Ambulatory Visit: Payer: Self-pay

## 2021-11-05 ENCOUNTER — Ambulatory Visit (INDEPENDENT_AMBULATORY_CARE_PROVIDER_SITE_OTHER): Payer: 59 | Admitting: Certified Nurse Midwife

## 2021-11-05 VITALS — BP 143/87 | HR 89 | Ht 63.0 in | Wt 193.4 lb

## 2021-11-05 DIAGNOSIS — Z3169 Encounter for other general counseling and advice on procreation: Secondary | ICD-10-CM | POA: Diagnosis not present

## 2021-11-05 MED ORDER — PROGESTERONE 100 MG VA SUPP: 1.0000 | Freq: Every day | VAGINAL | 2 refills | Status: DC

## 2021-11-05 MED ORDER — PROGESTERONE 200 MG VA SUPP: 200.0000 mg | Freq: Every day | VAGINAL | 0 refills | Status: DC

## 2021-11-05 NOTE — Progress Notes (Signed)
GYN ENCOUNTER NOTE  Subjective:       Donna Wallace is a 30 y.o. G43P0030 female is here for gynecologic evaluation of the following issues:  1. Interested in becoming pregnant, has been pregnant in the past. Has had a few miscarriages.    2.Pt states she has been diagnosed in the past with PCOS    Gynecologic History Patient's last menstrual period was 10/24/2021. Contraception: none stopped x 1 month ago, has had one cycle Last Pap: 07/23/21. Results were: normal Last mammogram: n/a.   Obstetric History OB History  Gravida Para Term Preterm AB Living  3       3    SAB IAB Ectopic Multiple Live Births  3            # Outcome Date GA Lbr Len/2nd Weight Sex Delivery Anes PTL Lv  3 SAB           2 SAB           1 SAB             Past Medical History:  Diagnosis Date   Anxiety    Mental disorder    anxiety and depression   Panic attacks    PONV (postoperative nausea and vomiting)    Renal disorder     Past Surgical History:  Procedure Laterality Date   CHOLECYSTECTOMY     MYRINGOTOMY     SHOULDER ARTHROSCOPY WITH SUBACROMIAL DECOMPRESSION Right 07/11/2021   Procedure: RIGHT SHOULDER ARTHROSCOPY WITH EXTENSIVE DEBRIDEMENT AND SUBACROMIAL DECOMPRESSION;  Surgeon: Mcarthur Rossetti, MD;  Location: [facility];  Service: Orthopedics;  Laterality: Right;   SHOULDER SURGERY      Current Outpatient Medications on File Prior to Visit  Medication Sig Dispense Refill   buPROPion (WELLBUTRIN XL) 150 MG 24 hr tablet Take 1 tablet by mouth every morning.     ferrous sulfate 324 MG TBEC Take 324 mg by mouth.     HYDROcodone-acetaminophen (NORCO/VICODIN) 5-325 MG tablet Take 1-2 tablets by mouth every 6 (six) hours as needed for moderate pain. 30 tablet 0   norethindrone-ethinyl estradiol-FE (LARIN FE 1/20) 1-20 MG-MCG tablet Take 1 tablet by mouth daily. 28 tablet 11   predniSONE (DELTASONE) 50 MG tablet One tablet daily for 5 days. 5 tablet 0   sertraline  (ZOLOFT) 100 MG tablet Take 150 mg by mouth daily.     No current facility-administered medications on file prior to visit.    Allergies  Allergen Reactions   Hydromorphone Hives, Swelling and Other (See Comments)   Peanut Oil Anaphylaxis, Itching and Rash   Peanut-Containing Drug Products Anaphylaxis, Itching and Rash   Dilaudid [Hydromorphone Hcl] Hives, Itching and Swelling   Fish Allergy Hives   Lidocaine     Lidocaine patch; has had local anes (incl.lidocaine) injections and sprays with no issues   Metronidazole Other (See Comments)    Blurred vision, dizziness. Patient states not allergic to Metrogel.    Zithromax [Azithromycin Dihydrate] Nausea And Vomiting and Other (See Comments)    REACTION: Light headedness    Social History   Socioeconomic History   Marital status: Married    Spouse name: Not on file   Number of children: Not on file   Years of education: Not on file   Highest education level: Not on file  Occupational History   Not on file  Tobacco Use   Smoking status: Former    Types: Cigarettes    Quit date:  12/01/2014    Years since quitting: 6.9   Smokeless tobacco: Never  Vaping Use   Vaping Use: Former   Substances: Nicotine, Flavoring  Substance and Sexual Activity   Alcohol use: Yes    Comment: occasional   Drug use: No   Sexual activity: Yes    Partners: Male    Birth control/protection: Pill  Other Topics Concern   Not on file  Social History Narrative   Not on file   Social Determinants of Health   Financial Resource Strain: Not on file  Food Insecurity: Not on file  Transportation Needs: Not on file  Physical Activity: Not on file  Stress: Not on file  Social Connections: Not on file  Intimate Partner Violence: Not on file    Family History  Problem Relation Age of Onset   Hypertension Mother    Cervical cancer Mother    COPD Mother    Asthma Mother    Migraines Mother    Hypertension Maternal Grandmother    Bipolar  disorder Maternal Grandmother    Schizophrenia Maternal Grandmother    Dementia Maternal Grandmother    Hypertension Maternal Grandfather    Clotting disorder Maternal Grandfather        blood clots in legs   Heart disease Maternal Grandfather    Cervical cancer Paternal Grandmother    Glaucoma Father     The following portions of the patient's history were reviewed and updated as appropriate: allergies, current medications, past family history, past medical history, past social history, past surgical history and problem list.  Review of Systems Review of Systems - Negative except as mentioned in HPI Review of Systems - General ROS: negative for - chills, fatigue, fever, hot flashes, malaise or night sweats Hematological and Lymphatic ROS: negative for - bleeding problems or swollen lymph nodes Gastrointestinal ROS: negative for - abdominal pain, blood in stools, change in bowel habits and nausea/vomiting Musculoskeletal ROS: negative for - joint pain, muscle pain or muscular weakness Genito-Urinary ROS: negative for - change in menstrual cycle, dysmenorrhea, dyspareunia, dysuria, genital discharge, genital ulcers, hematuria, incontinence, irregular/heavy menses, nocturia or pelvic painjj  Objective:   BP (!) 143/87   Pulse 89   Ht $R'5\' 3"'kE$  (1.6 m)   Wt 193 lb 6.4 oz (87.7 kg)   LMP 10/24/2021   BMI 34.26 kg/m  CONSTITUTIONAL: Well-developed, well-nourished female in no acute distress.  HENT:  Normocephalic, atraumatic.  NECK: Normal range of motion, supple, no masses.  Normal thyroid.  SKIN: Skin is warm and dry. No rash noted. Not diaphoretic. No erythema. No pallor. [place]: Alert and oriented to person, place, and time. PSYCHIATRIC: Normal mood and affect. Normal behavior. Normal judgment and thought content. CARDIOVASCULAR:Not Examined RESPIRATORY: Not Examined BREASTS: Not Examined ABDOMEN: Soft, non distended; Non tender.  No Organomegaly. PELVIC:not indicated.   MUSCULOSKELETAL: Normal range of motion. No tenderness.  No cyanosis, clubbing, or edema.     Assessment:   Fertility counseling Preconception counseling    Plan:   Discussed irregular ovulation associated with PCOS. PT state she has had one period already since stopping OCPS. Encouraged use of ovulation kit and app on phone for time of intercourse. Encouraged use of PNV, healthy lifestyle. Pt has history of 3 losses. Discussed use of progesterone. She is in agreement. Orders placed. Pt to follow up for confirmation or in several months should she not get pregnant to discussed use of Femara. She verbalizes and agree.   Face to face time 15 min.  Philip Aspen, CNM

## 2021-11-11 ENCOUNTER — Other Ambulatory Visit: Payer: Self-pay

## 2021-11-11 ENCOUNTER — Encounter: Payer: Self-pay | Admitting: Physician Assistant

## 2021-11-11 ENCOUNTER — Ambulatory Visit: Payer: Self-pay

## 2021-11-11 ENCOUNTER — Ambulatory Visit: Payer: 59 | Admitting: Physician Assistant

## 2021-11-11 ENCOUNTER — Ambulatory Visit (INDEPENDENT_AMBULATORY_CARE_PROVIDER_SITE_OTHER): Payer: 59 | Admitting: Physician Assistant

## 2021-11-11 DIAGNOSIS — M79671 Pain in right foot: Secondary | ICD-10-CM | POA: Diagnosis not present
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
## 2021-11-14 ENCOUNTER — Telehealth: Payer: Self-pay | Admitting: Orthopaedic Surgery

## 2021-11-14 NOTE — Telephone Encounter (Signed)
Do you mind seeing If Donna Wallace will see her today?

## 2021-11-14 NOTE — Telephone Encounter (Signed)
I called and talked to the pt. She is ok with being seen tomorrow. Pt scheduled. She declined the offer from something for pain.

## 2021-11-14 NOTE — Telephone Encounter (Signed)
[place] said she can see her tomorrow. She is seeing patients with Cleophas Dunker today.

## 2021-11-14 NOTE — Telephone Encounter (Signed)
Please advise if ok to work in.

## 2021-11-14 NOTE — Telephone Encounter (Signed)
Pt called requesting and appt today. Pt states she pulled she shoulder she had surgery on and cant lift her arm. Please call pt about this matter at [phone].

## 2021-11-15 ENCOUNTER — Encounter: Payer: Self-pay | Admitting: Physician Assistant

## 2021-11-15 ENCOUNTER — Ambulatory Visit (INDEPENDENT_AMBULATORY_CARE_PROVIDER_SITE_OTHER): Payer: 59 | Admitting: Physician Assistant

## 2021-11-15 ENCOUNTER — Other Ambulatory Visit: Payer: Self-pay

## 2021-11-15 ENCOUNTER — Ambulatory Visit (INDEPENDENT_AMBULATORY_CARE_PROVIDER_SITE_OTHER): Payer: 59

## 2021-11-15 DIAGNOSIS — Z9889 Other specified postprocedural states: Secondary | ICD-10-CM

## 2021-11-15 MED ORDER — METHYLPREDNISOLONE ACETATE 40 MG/ML IJ SUSP
40.0000 mg | INTRAMUSCULAR | Status: AC | PRN
  Administered 2021-11-15: 40 mg via INTRA_ARTICULAR

## 2021-11-15 MED ORDER — LIDOCAINE HCL 1 % IJ SOLN
5.0000 mL | INTRAMUSCULAR | Status: AC | PRN
Start: 2021-11-15 — End: 2021-11-15
  Administered 2021-11-15: 5 mL
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
## 2021-11-21 ENCOUNTER — Other Ambulatory Visit: Payer: Self-pay

## 2021-11-21 ENCOUNTER — Encounter: Payer: Self-pay | Admitting: Physician Assistant

## 2021-11-21 ENCOUNTER — Ambulatory Visit (INDEPENDENT_AMBULATORY_CARE_PROVIDER_SITE_OTHER): Payer: 59 | Admitting: Physician Assistant

## 2021-11-21 DIAGNOSIS — Z9889 Other specified postprocedural states: Secondary | ICD-10-CM
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
## 2021-12-04 ENCOUNTER — Encounter: Payer: Self-pay | Admitting: Certified Nurse Midwife

## 2021-12-09 ENCOUNTER — Other Ambulatory Visit: Payer: Self-pay

## 2021-12-09 ENCOUNTER — Encounter: Payer: Self-pay | Admitting: Physician Assistant

## 2021-12-09 ENCOUNTER — Ambulatory Visit (INDEPENDENT_AMBULATORY_CARE_PROVIDER_SITE_OTHER): Payer: BC Managed Care – PPO | Admitting: Physician Assistant

## 2021-12-09 DIAGNOSIS — M25511 Pain in right shoulder: Secondary | ICD-10-CM

## 2021-12-09 DIAGNOSIS — G8929 Other chronic pain: Secondary | ICD-10-CM

## 2021-12-09 MED ORDER — DIAZEPAM 5 MG PO TABS: ORAL_TABLET | ORAL | 0 refills | Status: DC

## 2021-12-09 NOTE — Progress Notes (Signed)
HPI: Donna Wallace returns today for follow-up of right shoulder pain.  History of right shoulder arthroscopy 07/12/2021 which consisted of a subacromial decompression and debridement.  No rotator cuff tear no arthrofibrosis.  She eventually did have some improvement after surgery but in late December of this year was throwing out some trash and reinjured the shoulder.  She states that the shoulder pain is not getting better despite cortisone injection subacromially on 11/05/2021.  She denies any radicular symptoms down the arm.  States pulling like sensation limited range of motion.   Review of systems: See HPI otherwise negative  Physical exam: General well-developed well-nourished female no acute distress. Bilateral shoulders 5 5 strength with external and internal rotation against resistance.  Empty can test negative bilaterally.  Impingement testing positive on the right negative on the left.  Positive liftoff test on the right negative on the left.  Passively able to bring right arm above her head.  Impression: Acute on chronic right shoulder pain  Plan given the fact the patient's failed conservative treatment including cortisone injection in his shoulder recommend repeat MRI to rule out rotator cuff tear.  We will keep out of work until after the MRI.  Should follow-up with Dr. Magnus Ivan after the MRI to go over results and discuss further treatment.  Is sent in Valium and she is claustrophobic.

## 2021-12-18 ENCOUNTER — Ambulatory Visit
Admission: RE | Admit: 2021-12-18 | Discharge: 2021-12-18 | Disposition: A | Discharge status: Home / Self Care | Payer: BC Managed Care – PPO | Source: Ambulatory Visit | Attending: Physician Assistant | Admitting: Physician Assistant

## 2021-12-18 DIAGNOSIS — G8929 Other chronic pain: Secondary | ICD-10-CM

## 2021-12-23 ENCOUNTER — Other Ambulatory Visit: Payer: Self-pay

## 2021-12-23 ENCOUNTER — Ambulatory Visit (INDEPENDENT_AMBULATORY_CARE_PROVIDER_SITE_OTHER): Payer: BC Managed Care – PPO | Admitting: Orthopaedic Surgery

## 2021-12-23 ENCOUNTER — Encounter: Payer: Self-pay | Admitting: Orthopaedic Surgery

## 2021-12-23 DIAGNOSIS — M25511 Pain in right shoulder: Secondary | ICD-10-CM

## 2021-12-23 DIAGNOSIS — G8929 Other chronic pain: Secondary | ICD-10-CM | POA: Diagnosis not present

## 2021-12-23 NOTE — Progress Notes (Signed)
The patient comes in today to go over MRI of her right shoulder.  We actually performed an arthroscopic intervention of her right shoulder in August of last year.  This was for arthrofibrosis.  She had recently experienced some type of injury to the right shoulder and her pain was worsening again.  Fortunately she is not developing arthrofibrosis but her pain was quite severe.  She did have 1 subacromial steroid injection in her right shoulder but that did not help much thus we sent her for an MRI of her right shoulder to rule out any type of rotator cuff tear.  If her shoulder does move smoothly and fluidly with no blocks to rotation but is very painful for her to move her right shoulder.  There is no evidence of arthrofibrosis at this point.  MRI of her right shoulder does show some bursal fraying of the rotator cuff there is intrasubstance signal in changes within the subscapularis tendon I think this was bothering her the most.  I would like to send her to Dr. Ernestina Patches for a one-time right shoulder intra-articular steroid injection in the glenohumeral joint and hopefully this will be a therapeutic injection for her.  She agrees with this treatment plan.  We will schedule her to come back and see [place] in 4 weeks and by then hopefully she will have had that injection and we can go from there in terms of other treatment recommendations.

## 2021-12-25 ENCOUNTER — Encounter: Payer: Self-pay | Admitting: Certified Nurse Midwife

## 2021-12-25 ENCOUNTER — Other Ambulatory Visit: Payer: Self-pay | Admitting: Certified Nurse Midwife

## 2021-12-25 DIAGNOSIS — Z3200 Encounter for pregnancy test, result unknown: Secondary | ICD-10-CM

## 2021-12-26 ENCOUNTER — Other Ambulatory Visit: Payer: BC Managed Care – PPO

## 2021-12-26 ENCOUNTER — Other Ambulatory Visit: Payer: Self-pay

## 2021-12-26 DIAGNOSIS — Z3200 Encounter for pregnancy test, result unknown: Secondary | ICD-10-CM

## 2021-12-27 LAB — BETA HCG QUANT (REF LAB): hCG Quant: <1 m[IU]/mL

## 2021-12-31 ENCOUNTER — Encounter: Payer: BC Managed Care – PPO | Admitting: Certified Nurse Midwife

## 2022-01-01 ENCOUNTER — Ambulatory Visit: Payer: BC Managed Care – PPO | Admitting: Physical Therapy

## 2022-01-01 ENCOUNTER — Encounter: Payer: Self-pay | Admitting: Certified Nurse Midwife

## 2022-01-01 ENCOUNTER — Other Ambulatory Visit: Payer: Self-pay | Admitting: Certified Nurse Midwife

## 2022-01-01 MED ORDER — LETROZOLE 2.5 MG PO TABS: 2.5000 mg | ORAL_TABLET | Freq: Every day | ORAL | 0 refills | Status: DC

## 2022-01-02 ENCOUNTER — Ambulatory Visit: Payer: BC Managed Care – PPO | Attending: Physician Assistant

## 2022-01-02 ENCOUNTER — Other Ambulatory Visit: Payer: Self-pay

## 2022-01-02 ENCOUNTER — Encounter: Payer: Self-pay | Admitting: Physical Medicine and Rehabilitation

## 2022-01-02 ENCOUNTER — Ambulatory Visit: Payer: Self-pay

## 2022-01-02 ENCOUNTER — Ambulatory Visit (INDEPENDENT_AMBULATORY_CARE_PROVIDER_SITE_OTHER): Payer: BC Managed Care – PPO | Admitting: Physical Medicine and Rehabilitation

## 2022-01-02 DIAGNOSIS — G8929 Other chronic pain: Secondary | ICD-10-CM

## 2022-01-02 DIAGNOSIS — Z9889 Other specified postprocedural states: Secondary | ICD-10-CM | POA: Insufficient documentation

## 2022-01-02 DIAGNOSIS — M6281 Muscle weakness (generalized): Secondary | ICD-10-CM | POA: Insufficient documentation

## 2022-01-02 DIAGNOSIS — M25511 Pain in right shoulder: Secondary | ICD-10-CM | POA: Diagnosis not present

## 2022-01-02 NOTE — Progress Notes (Signed)
° °  Donna Wallace - 31 y.o. female MRN [id number]  Date of birth: 1990/12/02  Office Visit Note: Visit Date: 01/02/2022 PCP: [facility] Referred by: [facility]  Subjective: Chief Complaint  Patient presents with   Right Shoulder - Pain   HPI:  Donna Wallace is a 31 y.o. female who comes in today at the request of Dr. Doneen Poisson for planned Right anesthetic glenohumeral arthrogram with fluoroscopic guidance.  The patient has failed conservative care including home exercise, medications, time and activity modification.  This injection will be diagnostic and hopefully therapeutic.  Please see requesting physician notes for further details and justification.   ROS Otherwise per HPI.  Assessment & Plan: Visit Diagnoses:    ICD-10-CM   1. Chronic right shoulder pain  M25.511 Large Joint Inj: R glenohumeral   G89.29 XR C-ARM NO REPORT      Plan: No additional findings.   Meds & Orders: No orders of the defined types were placed in this encounter.   Orders Placed This Encounter  Procedures   Large Joint Inj: R glenohumeral   XR C-ARM NO REPORT    Follow-up: Return for Doneen Poisson, MD as scheduled.   Procedures: Large Joint Inj: R glenohumeral on 01/02/2022 8:41 AM Indications: pain and diagnostic evaluation Details: 22 G 3.5 in needle, fluoroscopy-guided anteromedial approach  Arthrogram: No  Medications: 40 mg triamcinolone acetonide 40 MG/ML; 5 mL bupivacaine 0.25 % Outcome: tolerated well, no immediate complications  There was excellent flow of contrast producing a partial arthrogram of the glenohumeral joint. The patient did have mild relief of symptoms during the anesthetic phase of the injection. Procedure, treatment alternatives, risks and benefits explained, specific risks discussed. Consent was given by the patient. Immediately prior to procedure a time out was called to verify the correct patient, procedure, equipment,  support staff and site/side marked as required. Patient was prepped and draped in the usual sterile fashion.         Clinical History: No specialty comments available.     Objective:  VS:  HT:     WT:    BMI:      BP:    HR: bpm   TEMP: ( )   RESP:  Physical Exam   Imaging: No results found.

## 2022-01-02 NOTE — Progress Notes (Signed)
Pt state right shoulder pain. Pt state any movement with her right arm makes the pain worse. Pt state she takes over the counter pain meds to help ease her pain.  Numeric Pain Rating Scale and Functional Assessment Average Pain 3   In the last MONTH (on 0-10 scale) has pain interfered with the following?  1. General activity like being  able to carry out your everyday physical activities such as walking, climbing stairs, carrying groceries, or moving a chair?  Rating(9)   +Driver, -BT, -Dye Allergies.
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
## 2022-01-07 ENCOUNTER — Ambulatory Visit: Payer: BC Managed Care – PPO | Admitting: Physical Therapy

## 2022-01-09 ENCOUNTER — Ambulatory Visit: Payer: BC Managed Care – PPO | Admitting: Physical Therapy

## 2022-01-11 MED ORDER — TRIAMCINOLONE ACETONIDE 40 MG/ML IJ SUSP
40.0000 mg | INTRAMUSCULAR | Status: AC | PRN
Start: 2022-01-02 — End: 2022-01-02
  Administered 2022-01-02: 40 mg via INTRA_ARTICULAR

## 2022-01-11 MED ORDER — BUPIVACAINE HCL 0.25 % IJ SOLN
5.0000 mL | INTRAMUSCULAR | Status: AC | PRN
Start: 2022-01-02 — End: 2022-01-02
  Administered 2022-01-02: 5 mL via INTRA_ARTICULAR

## 2022-01-14 ENCOUNTER — Ambulatory Visit: Payer: BC Managed Care – PPO | Admitting: Physical Therapy

## 2022-01-16 ENCOUNTER — Ambulatory Visit: Payer: BC Managed Care – PPO | Admitting: Physical Therapy

## 2022-01-16 ENCOUNTER — Encounter: Payer: Self-pay | Admitting: Certified Nurse Midwife

## 2022-01-17 ENCOUNTER — Other Ambulatory Visit: Payer: Self-pay | Admitting: Certified Nurse Midwife

## 2022-01-17 DIAGNOSIS — N839 Noninflammatory disorder of ovary, fallopian tube and broad ligament, unspecified: Secondary | ICD-10-CM

## 2022-01-20 ENCOUNTER — Ambulatory Visit
Admission: RE | Admit: 2022-01-20 | Discharge: 2022-01-20 | Disposition: A | Discharge status: Home / Self Care | Payer: BC Managed Care – PPO | Source: Ambulatory Visit | Attending: Certified Nurse Midwife | Admitting: Certified Nurse Midwife

## 2022-01-20 ENCOUNTER — Encounter: Payer: Self-pay | Admitting: Certified Nurse Midwife

## 2022-01-20 ENCOUNTER — Other Ambulatory Visit: Payer: Self-pay

## 2022-01-20 ENCOUNTER — Ambulatory Visit (INDEPENDENT_AMBULATORY_CARE_PROVIDER_SITE_OTHER): Payer: BC Managed Care – PPO | Admitting: Orthopaedic Surgery

## 2022-01-20 ENCOUNTER — Encounter: Payer: Self-pay | Admitting: Orthopaedic Surgery

## 2022-01-20 DIAGNOSIS — N839 Noninflammatory disorder of ovary, fallopian tube and broad ligament, unspecified: Secondary | ICD-10-CM

## 2022-01-20 DIAGNOSIS — M25511 Pain in right shoulder: Secondary | ICD-10-CM | POA: Diagnosis not present

## 2022-01-20 DIAGNOSIS — G8929 Other chronic pain: Secondary | ICD-10-CM

## 2022-01-20 NOTE — Progress Notes (Signed)
The patient comes in today for continued follow-up after having an intra-articular injection in her right shoulder joint by Dr. Ernestina Patches and 1 therapy session.  She is feeling much better overall and reports her right shoulder is about 70% better.  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  There is no evidence of arthrofibrosis or frozen shoulder at this time with the right shoulder.  Her range of motion is full and smooth and there is no weakness.  This point follow-up can be as needed since she is doing so well.  She will be careful with her shoulder with having lifting and repetitive overhead activities but otherwise will let us know if she needs another injection at some point.

## 2022-01-21 ENCOUNTER — Ambulatory Visit: Payer: BC Managed Care – PPO | Admitting: Physical Therapy

## 2022-01-22 ENCOUNTER — Ambulatory Visit: Payer: BC Managed Care – PPO

## 2022-01-23 ENCOUNTER — Ambulatory Visit: Payer: BC Managed Care – PPO | Admitting: Physical Therapy

## 2022-01-28 ENCOUNTER — Encounter: Payer: BC Managed Care – PPO | Admitting: Physical Therapy

## 2022-01-29 ENCOUNTER — Encounter: Payer: Self-pay | Admitting: Certified Nurse Midwife

## 2022-01-30 ENCOUNTER — Encounter: Payer: BC Managed Care – PPO | Admitting: Physical Therapy

## 2022-02-03 ENCOUNTER — Encounter: Payer: Self-pay | Admitting: Certified Nurse Midwife

## 2022-02-03 ENCOUNTER — Other Ambulatory Visit: Payer: Self-pay | Admitting: Certified Nurse Midwife

## 2022-02-04 ENCOUNTER — Encounter: Payer: BC Managed Care – PPO | Admitting: Physical Therapy

## 2022-02-04 MED ORDER — LETROZOLE 2.5 MG PO TABS: 2.5000 mg | ORAL_TABLET | Freq: Every day | ORAL | 0 refills | Status: DC

## 2022-02-05 ENCOUNTER — Telehealth: Payer: Self-pay | Admitting: Certified Nurse Midwife

## 2022-02-05 NOTE — Telephone Encounter (Signed)
Pt called asking about medication she stated that today is Day 3 of her cycle - confirmed pharmacy as [facility] [address]. Please Advsie.  ?

## 2022-02-05 NOTE — Telephone Encounter (Signed)
Rx confirmed by pharmacy as of 02/04/22 ?

## 2022-02-06 ENCOUNTER — Encounter: Payer: BC Managed Care – PPO | Admitting: Physical Therapy

## 2022-02-11 ENCOUNTER — Encounter: Payer: Self-pay | Admitting: Certified Nurse Midwife

## 2022-02-11 ENCOUNTER — Other Ambulatory Visit: Payer: Self-pay | Admitting: Certified Nurse Midwife

## 2022-02-11 ENCOUNTER — Encounter: Payer: BC Managed Care – PPO | Admitting: Physical Therapy

## 2022-02-11 DIAGNOSIS — N839 Noninflammatory disorder of ovary, fallopian tube and broad ligament, unspecified: Secondary | ICD-10-CM

## 2022-02-12 ENCOUNTER — Telehealth: Payer: Self-pay | Admitting: Certified Nurse Midwife

## 2022-02-12 NOTE — Telephone Encounter (Signed)
Pt called stating that she is having 7 out of 10 pain in right ovary area, states this has been present for 2 days and worsening. Please Advise.  ?

## 2022-02-13 ENCOUNTER — Encounter: Payer: BC Managed Care – PPO | Admitting: Physical Therapy

## 2022-02-14 ENCOUNTER — Other Ambulatory Visit: Payer: Self-pay | Admitting: Certified Nurse Midwife

## 2022-02-14 DIAGNOSIS — R1031 Right lower quadrant pain: Secondary | ICD-10-CM

## 2022-02-14 DIAGNOSIS — Z79811 Long term (current) use of aromatase inhibitors: Secondary | ICD-10-CM

## 2022-02-14 NOTE — Telephone Encounter (Signed)
LM with pt. Relating info ?

## 2022-02-17 ENCOUNTER — Encounter: Payer: Self-pay | Admitting: Certified Nurse Midwife

## 2022-02-18 ENCOUNTER — Encounter: Payer: BC Managed Care – PPO | Admitting: Physical Therapy

## 2022-02-20 ENCOUNTER — Encounter: Payer: BC Managed Care – PPO | Admitting: Physical Therapy

## 2022-02-21 ENCOUNTER — Other Ambulatory Visit: Payer: Self-pay

## 2022-02-21 ENCOUNTER — Other Ambulatory Visit: Payer: BC Managed Care – PPO

## 2022-02-21 DIAGNOSIS — N839 Noninflammatory disorder of ovary, fallopian tube and broad ligament, unspecified: Secondary | ICD-10-CM

## 2022-02-22 LAB — PROGESTERONE: Progesterone: 12.5 ng/mL

## 2022-02-24 ENCOUNTER — Encounter: Payer: Self-pay | Admitting: Certified Nurse Midwife

## 2022-02-24 ENCOUNTER — Other Ambulatory Visit: Payer: BC Managed Care – PPO

## 2022-02-25 ENCOUNTER — Encounter: Payer: BC Managed Care – PPO | Admitting: Physical Therapy

## 2022-02-27 ENCOUNTER — Encounter: Payer: BC Managed Care – PPO | Admitting: Physical Therapy

## 2022-03-06 ENCOUNTER — Encounter: Payer: Self-pay | Admitting: Certified Nurse Midwife

## 2022-03-07 ENCOUNTER — Other Ambulatory Visit: Payer: Self-pay | Admitting: Certified Nurse Midwife

## 2022-03-10 ENCOUNTER — Other Ambulatory Visit: Payer: Self-pay | Admitting: Certified Nurse Midwife

## 2022-03-10 MED ORDER — LETROZOLE 2.5 MG PO TABS: 5.0000 mg | ORAL_TABLET | Freq: Every day | ORAL | 0 refills | Status: DC

## 2022-03-18 ENCOUNTER — Ambulatory Visit: Payer: BC Managed Care – PPO | Admitting: Certified Nurse Midwife

## 2022-03-18 ENCOUNTER — Encounter: Payer: Self-pay | Admitting: Certified Nurse Midwife

## 2022-03-18 VITALS — BP 125/86 | HR 90 | Ht 63.0 in | Wt 190.3 lb

## 2022-03-18 DIAGNOSIS — N979 Female infertility, unspecified: Secondary | ICD-10-CM | POA: Diagnosis not present

## 2022-03-18 DIAGNOSIS — Z319 Encounter for procreative management, unspecified: Secondary | ICD-10-CM
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
## 2022-03-19 ENCOUNTER — Ambulatory Visit: Admission: RE | Admit: 2022-03-19 | Payer: BC Managed Care – PPO | Source: Ambulatory Visit

## 2022-03-19 ENCOUNTER — Other Ambulatory Visit: Payer: Self-pay | Admitting: Certified Nurse Midwife

## 2022-03-19 DIAGNOSIS — Z319 Encounter for procreative management, unspecified: Secondary | ICD-10-CM

## 2022-03-20 ENCOUNTER — Ambulatory Visit: Admission: RE | Admit: 2022-03-20 | Payer: BC Managed Care – PPO | Source: Ambulatory Visit

## 2022-03-20 ENCOUNTER — Ambulatory Visit
Admission: RE | Admit: 2022-03-20 | Discharge: 2022-03-20 | Disposition: A | Discharge status: Home / Self Care | Payer: BC Managed Care – PPO | Source: Ambulatory Visit | Attending: Certified Nurse Midwife | Admitting: Certified Nurse Midwife

## 2022-03-20 DIAGNOSIS — Z319 Encounter for procreative management, unspecified: Secondary | ICD-10-CM | POA: Diagnosis present

## 2022-03-21 ENCOUNTER — Encounter: Payer: Self-pay | Admitting: Certified Nurse Midwife

## 2022-03-23 ENCOUNTER — Encounter: Payer: Self-pay | Admitting: Certified Nurse Midwife

## 2022-03-26 ENCOUNTER — Encounter: Payer: Self-pay | Admitting: Certified Nurse Midwife

## 2022-03-31 ENCOUNTER — Encounter: Payer: Self-pay | Admitting: Certified Nurse Midwife

## 2022-04-02 ENCOUNTER — Encounter: Payer: Self-pay | Admitting: Certified Nurse Midwife

## 2022-04-02 ENCOUNTER — Telehealth: Payer: BC Managed Care – PPO | Admitting: Physician Assistant

## 2022-04-02 NOTE — Progress Notes (Signed)
The patient no-showed for appointment despite this provider sending direct link, reaching out via phone with no response and waiting for at least 10 minutes from appointment time for patient to join. They will be marked as a NS for this appointment/time.  ? ?Hilario Robarts Cody Delisa Finck, PA-C ? ? ? ?

## 2022-04-06 ENCOUNTER — Other Ambulatory Visit: Payer: Self-pay | Admitting: Certified Nurse Midwife

## 2022-04-06 ENCOUNTER — Encounter: Payer: Self-pay | Admitting: Certified Nurse Midwife

## 2022-04-07 ENCOUNTER — Other Ambulatory Visit: Payer: Self-pay | Admitting: Certified Nurse Midwife

## 2022-04-07 DIAGNOSIS — M549 Dorsalgia, unspecified: Secondary | ICD-10-CM | POA: Diagnosis not present

## 2022-04-07 DIAGNOSIS — R102 Pelvic and perineal pain: Secondary | ICD-10-CM | POA: Insufficient documentation

## 2022-04-07 DIAGNOSIS — N939 Abnormal uterine and vaginal bleeding, unspecified: Secondary | ICD-10-CM | POA: Insufficient documentation

## 2022-04-07 LAB — BASIC METABOLIC PANEL
Anion gap: 5 (ref 5–15)
BUN: 14 mg/dL (ref 6–20)
CO2: 23 mmol/L (ref 22–32)
Calcium: 9.9 mg/dL (ref 8.9–10.3)
Chloride: 108 mmol/L (ref 98–111)
Creatinine, Ser: 0.94 mg/dL (ref 0.44–1.00)
GFR, Estimated: >60 mL/min (ref >60)
Glucose, Bld: 111 mg/dL — ABNORMAL HIGH (ref 70–99)
Potassium: 3.8 mmol/L (ref 3.5–5.1)
Sodium: 136 mmol/L (ref 135–145)

## 2022-04-07 LAB — CBC
HCT: 37 % (ref 36.0–46.0)
Hemoglobin: 12.1 g/dL (ref 12.0–15.0)
MCH: 29.2 pg (ref 26.0–34.0)
MCHC: 32.7 g/dL (ref 30.0–36.0)
MCV: 89.2 fL (ref 80.0–100.0)
Platelets: 293 10*3/uL (ref 150–400)
RBC: 4.15 MIL/uL (ref 3.87–5.11)
RDW: 12.7 % (ref 11.5–15.5)
WBC: 10.8 10*3/uL — ABNORMAL HIGH (ref 4.0–10.5)
nRBC: 0 % (ref 0.0–0.2)

## 2022-04-07 LAB — HCG, QUANTITATIVE, PREGNANCY: hCG, Beta Chain, Quant, S: 1 m[IU]/mL (ref <5)

## 2022-04-07 MED ORDER — LETROZOLE 2.5 MG PO TABS: 5.0000 mg | ORAL_TABLET | Freq: Every day | ORAL | 0 refills | Status: DC

## 2022-04-07 NOTE — ED Triage Notes (Signed)
Pt presents via POV c/o vaginal bleeding with bright red blood and clots per pt report. Unknown pregnancy status. Reports taking infertility medication per pt report. Reports lower abd pain extending to back.  ?

## 2022-04-08 ENCOUNTER — Emergency Department: Payer: BC Managed Care – PPO

## 2022-04-08 ENCOUNTER — Encounter: Payer: BC Managed Care – PPO | Admitting: Obstetrics

## 2022-04-08 ENCOUNTER — Emergency Department
Admission: EM | Admit: 2022-04-08 | Discharge: 2022-04-08 | Disposition: A | Discharge status: Home / Self Care | Payer: BC Managed Care – PPO | Attending: Emergency Medicine | Admitting: Emergency Medicine

## 2022-04-08 DIAGNOSIS — N939 Abnormal uterine and vaginal bleeding, unspecified: Secondary | ICD-10-CM

## 2022-04-08 LAB — URINALYSIS, COMPLETE (UACMP) WITH MICROSCOPIC
Bilirubin Urine: NEGATIVE
Glucose, UA: NEGATIVE mg/dL
Ketones, ur: NEGATIVE mg/dL
Nitrite: NEGATIVE
Protein, ur: 30 mg/dL — AB
RBC / HPF: >50 RBC/hpf — ABNORMAL HIGH (ref 0–5)
Specific Gravity, Urine: 1.011 (ref 1.005–1.030)
pH: 5 (ref 5.0–8.0)

## 2022-04-08 LAB — CHLAMYDIA/NGC RT PCR (ARMC ONLY)
Chlamydia Tr: NOT DETECTED
N gonorrhoeae: NOT DETECTED

## 2022-04-08 LAB — SAMPLE TO BLOOD BANK

## 2022-04-08 LAB — WET PREP, GENITAL
Clue Cells Wet Prep HPF POC: NONE SEEN
Sperm: NONE SEEN
Trich, Wet Prep: NONE SEEN
WBC, Wet Prep HPF POC: <10 (ref <10)
Yeast Wet Prep HPF POC: NONE SEEN

## 2022-04-08 MED ORDER — OXYCODONE-ACETAMINOPHEN 5-325 MG PO TABS: 1.0000 | ORAL_TABLET | Freq: Three times a day (TID) | ORAL | 0 refills | Status: AC | PRN

## 2022-04-08 MED ORDER — CEPHALEXIN 500 MG PO CAPS
500.0000 mg | ORAL_CAPSULE | Freq: Four times a day (QID) | ORAL | 0 refills | Status: AC
Start: 2022-04-08 — End: 2022-04-13

## 2022-04-08 MED ORDER — KETOROLAC TROMETHAMINE 30 MG/ML IJ SOLN
15.0000 mg | Freq: Once | INTRAMUSCULAR | Status: AC
  Administered 2022-04-08: 15 mg via INTRAVENOUS
  Filled 2022-04-08: qty 1

## 2022-04-08 MED ORDER — CEFTRIAXONE SODIUM 1 G IJ SOLR
1.0000 g | Freq: Once | INTRAMUSCULAR | Status: AC
  Administered 2022-04-08: 1 g via INTRAVENOUS
  Filled 2022-04-08: qty 10

## 2022-04-08 NOTE — ED Provider Notes (Addendum)
? ?[facility] ?Provider Note ? ? ? Event Date/Time  ? First MD Initiated Contact with Patient 04/08/22 0216   ?  (approximate) ? ? ?History  ? ?Vaginal Bleeding ? ? ?HPI ? ?Donna Wallace is a 31 y.o. female G3P0030 with a past medical history of anxiety and depression who presents for evaluation of crampy abdominal pain rating to her back associate with vaginal bleeding.  Patient notes she is currently undergoing fertility treatment.  She states she had some spotting on 5/5 which seemed to resolve on its own before she developed significant bleeding today associated with abdominal cramping and back pain.  No headache, earache, sore throat, fevers, cough, vomiting, diarrhea, other vaginal discharge or burning with urination although patient states he has had some pelvic pressure.  No rash or extremity pain.  No other concerns at this time.  She did try some ibuprofen earlier today but this did not seem to help much.  She states she has an appointment with OB for later today. ? ?  ?Past Medical History:  ?Diagnosis Date  ? Anxiety   ? Mental disorder   ? anxiety and depression  ? Panic attacks   ? PONV (postoperative nausea and vomiting)   ? Renal disorder   ? ? ? ?Physical Exam  ?Triage Vital Signs: ?ED Triage Vitals  ?Enc Vitals Group  ?   BP 04/07/22 2143 (!) 140/102  ?   Pulse Rate 04/07/22 2143 82  ?   Resp 04/07/22 2143 16  ?   Temp 04/07/22 2143 98.2 ?F (36.8 ?C)  ?   Temp Source 04/07/22 2143 Oral  ?   SpO2 04/07/22 2143 97 %  ?   Weight 04/07/22 2155 190 lb (86.2 kg)  ?   Height 04/07/22 2155 5\' 3"  (1.6 m)  ?   Head Circumference --   ?   Peak Flow --   ?   Pain Score 04/07/22 2155 7  ?   Pain Loc --   ?   Pain Edu? --   ?   Excl. in GC? --   ? ? ?Most recent vital signs: ?Vitals:  ? 04/07/22 2143 04/08/22 0336  ?BP: (!) 140/102 (!) 127/92  ?Pulse: 82 81  ?Resp: 16 20  ?Temp: 98.2 ?F (36.8 ?C) 98 ?F (36.7 ?C)  ?SpO2: 97% 100%  ? ? ?General: Awake, no distress.  ?CV:  Good  peripheral perfusion.  ?Resp:  Normal effort.  ?Abd:  No distention.  Mild suprapubic discomfort. ?Other:  Exam shows some scant oozing from close cervical os without significant erythema friability or purulence. ? ? ?ED Results / Procedures / Treatments  ?Labs ?(all labs ordered are listed, but only abnormal results are displayed) ?Labs Reviewed  ?CBC - Abnormal; Notable for the following components:  ?    Result Value  ? WBC 10.8 (*)   ? All other components within normal limits  ?BASIC METABOLIC PANEL - Abnormal; Notable for the following components:  ? Glucose, Bld 111 (*)   ? All other components within normal limits  ?URINALYSIS, COMPLETE (UACMP) WITH MICROSCOPIC - Abnormal; Notable for the following components:  ? Color, Urine YELLOW (*)   ? APPearance CLOUDY (*)   ? Hgb urine dipstick LARGE (*)   ? Protein, ur 30 (*)   ? Leukocytes,Ua SMALL (*)   ? RBC / HPF >50 (*)   ? Bacteria, UA RARE (*)   ? All other components within normal limits  ?WET  PREP, GENITAL  ?URINE CULTURE  ?CHLAMYDIA/NGC RT PCR ([facility] ONLY)            ?HCG, QUANTITATIVE, PREGNANCY  ? ? ? ?EKG ? ? ?RADIOLOGY ? ?Pelvic ultrasound my interpretation without evidence of endometrial mass or ovarian mass.  I reviewed radiology interpretation and agree their findings of normal bilateral adnexa with.  Lower showing normal low resistance waveforms arterial and venous on both sides with normal thickness endometrium without any focal abnormalities noted. ? ? ?PROCEDURES: ? ?Critical Care performed: No ? ?Procedures ? ? ?MEDICATIONS ORDERED IN ED: ?Medications  ?ketorolac (TORADOL) 30 MG/ML injection 15 mg (15 mg Intravenous Given 04/08/22 0247)  ? ? ? ?IMPRESSION / MDM / ASSESSMENT AND PLAN / ED COURSE  ?I reviewed the triage vital signs and the nursing notes. ?             ?               ? ?Differential diagnosis includes, but is not limited to pregnancy, ectopic, dysfunctional uterine bleeding, cystitis, torsion.  ? ?CBC shows WBC count of 10.8,  hemoglobin of 12.1 and normal platelets.  Be without significant electrolyte or metabolic derangements.  hCG is negative.  UA shows large hemoglobin and 30 protein with small leukocyte esterase and greater than 50 RBCs with 21-50 WBCs.  There are some rare bacteria noted but also some squamous epithelial cells.  Urine culture sent.  Blood type is negative.  GC studies sent. ? ?Pelvic ultrasound my interpretation without evidence of endometrial mass or ovarian mass.  I reviewed radiology interpretation and agree their findings of normal bilateral adnexa with.  Lower showing normal low resistance waveforms arterial and venous on both sides with normal thickness endometrium without any focal abnormalities noted. ?  ? ?Suspect some dysfunction of bleeding in the setting of patient's medications for fertility likely related to hormonal imbalance or dysregulation.  Think she is appropriate for discharge with outpatient follow-up later today which she is already scheduled with OB/GYN.  Given she reports pressure with urination and is concerned about possible UTI we will also treat with a course of Keflex.  She was given a dose of Rocephin prior to discharge while completing initial ED work-up.  Low suspicion for sepsis pyelonephritis or other immediate life-threatening process related to this.  Discussed returning for any new or worsening symptoms.  She is declining any opiate on she is emergency room is requesting a couple tablets for severe breakthrough pain over the next couple days that she is already taking 800 mg ibuprofen.  Medic this is reasonable.  Discharged stable condition.  Strict return precautions advised and discussed. ? ? ?FINAL CLINICAL IMPRESSION(S) / ED DIAGNOSES  ? ?Final diagnoses:  ?Vaginal bleeding  ? ? ? ?Rx / DC Orders  ? ?ED Discharge Orders   ? ?      Ordered  ?  oxyCODONE-acetaminophen (PERCOCET) 5-325 MG tablet  Every 8 hours PRN       ? 04/08/22 0410  ? ?  ?  ? ?  ? ? ? ?Note:  This document  was prepared using Dragon voice recognition software and may include unintentional dictation errors. ?  ?Gilles Chiquito, MD ?04/08/22 0411 ? ?  ?Gilles Chiquito, MD ?04/08/22 [phone] ? ?

## 2022-04-09 LAB — URINE CULTURE: Culture: NO GROWTH

## 2022-04-15 ENCOUNTER — Other Ambulatory Visit: Payer: Self-pay

## 2022-04-15 ENCOUNTER — Encounter: Payer: Self-pay | Admitting: Orthopaedic Surgery

## 2022-04-15 DIAGNOSIS — G8929 Other chronic pain: Secondary | ICD-10-CM

## 2022-05-05 ENCOUNTER — Encounter: Payer: Self-pay | Admitting: Certified Nurse Midwife

## 2022-05-05 ENCOUNTER — Other Ambulatory Visit: Payer: Self-pay | Admitting: Certified Nurse Midwife

## 2022-05-06 MED ORDER — LETROZOLE 2.5 MG PO TABS: 5.0000 mg | ORAL_TABLET | Freq: Every day | ORAL | 0 refills | Status: DC

## 2022-05-21 ENCOUNTER — Encounter: Payer: Self-pay | Admitting: Certified Nurse Midwife

## 2022-05-26 ENCOUNTER — Other Ambulatory Visit: Payer: Self-pay | Admitting: Certified Nurse Midwife

## 2022-05-26 ENCOUNTER — Ambulatory Visit: Payer: BC Managed Care – PPO | Admitting: Physical Medicine and Rehabilitation

## 2022-05-26 ENCOUNTER — Ambulatory Visit: Payer: Self-pay

## 2022-05-26 DIAGNOSIS — M25511 Pain in right shoulder: Secondary | ICD-10-CM | POA: Diagnosis not present

## 2022-05-26 DIAGNOSIS — G8929 Other chronic pain: Secondary | ICD-10-CM

## 2022-05-26 MED ORDER — LETROZOLE 2.5 MG PO TABS
7.5000 mg | ORAL_TABLET | Freq: Every day | ORAL | 0 refills | Status: DC
Start: 2022-05-26 — End: 2022-05-26

## 2022-05-26 MED ORDER — BUPIVACAINE HCL 0.25 % IJ SOLN
5.0000 mL | INTRAMUSCULAR | Status: AC | PRN
  Administered 2022-05-26: 5 mL via INTRA_ARTICULAR

## 2022-05-26 MED ORDER — LETROZOLE 2.5 MG PO TABS: 7.5000 mg | ORAL_TABLET | Freq: Every day | ORAL | 0 refills | Status: AC

## 2022-05-26 MED ORDER — TRIAMCINOLONE ACETONIDE 40 MG/ML IJ SUSP
40.0000 mg | INTRAMUSCULAR | Status: AC | PRN
  Administered 2022-05-26: 40 mg via INTRA_ARTICULAR
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
## 2022-07-29 ENCOUNTER — Encounter: Payer: 59 | Admitting: Certified Nurse Midwife

## 2023-06-28 IMAGING — US US PELVIS COMPLETE TRANSABD/TRANSVAG W DUPLEX AND/OR DOPPLER
1 series · 13 of 25 positions shown · non-contrast
Comparison: 03/20/2022

CLINICAL DATA: Pelvic pain

EXAM:
TRANSABDOMINAL AND TRANSVAGINAL ULTRASOUND OF PELVIS
DOPPLER ULTRASOUND OF OVARIES
TECHNIQUE: Both transabdominal and transvaginal ultrasound examinations of the
pelvis were performed. Transabdominal technique was performed for
global imaging of the pelvis including uterus, ovaries, adnexal
regions, and pelvic cul-de-sac.
It was necessary to proceed with endovaginal exam following the
transabdominal exam to visualize the uterus, endometrium, ovaries
and adnexa. Color and duplex Doppler ultrasound was utilized to
evaluate blood flow to the ovaries.

[Series 1: us pelvic complete w transvaginal and torsion righ · 13 of 102 slices shown]
[im 1/102]
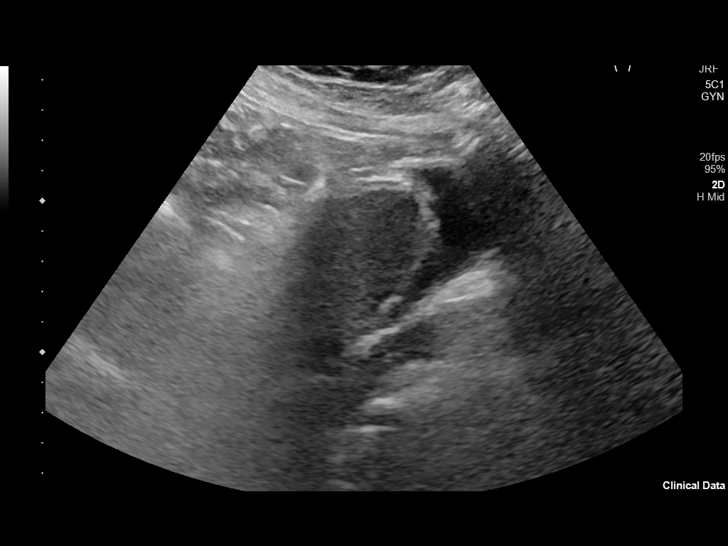
[im 9/102]
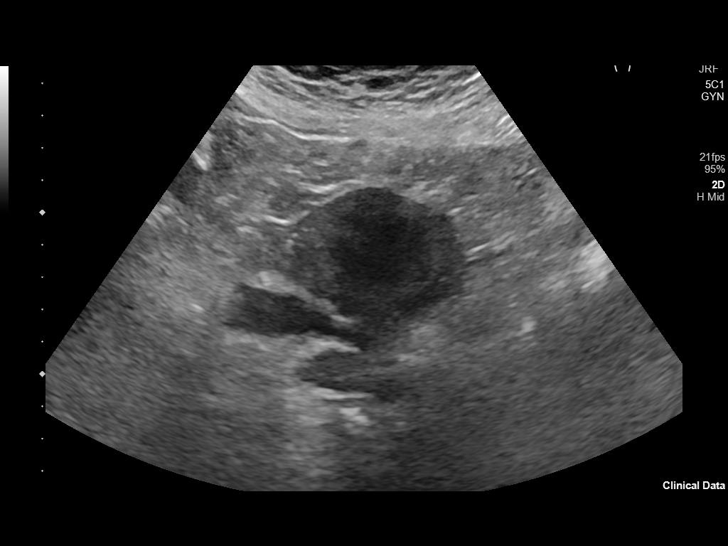
[im 17/102]
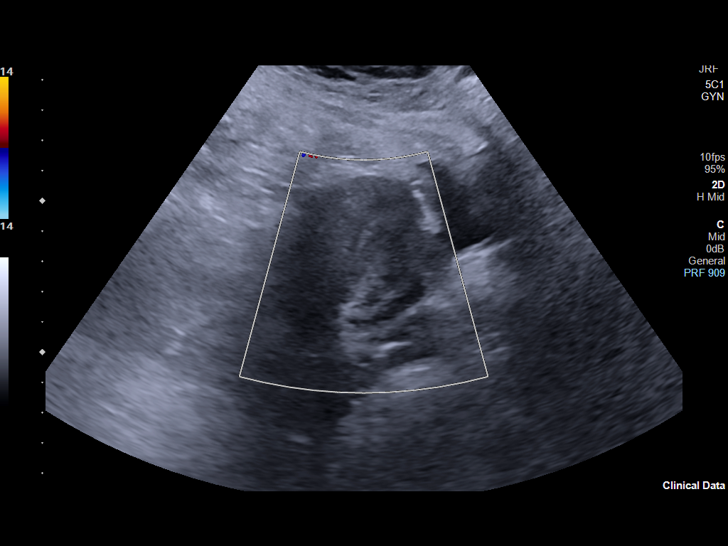
[im 26/102]
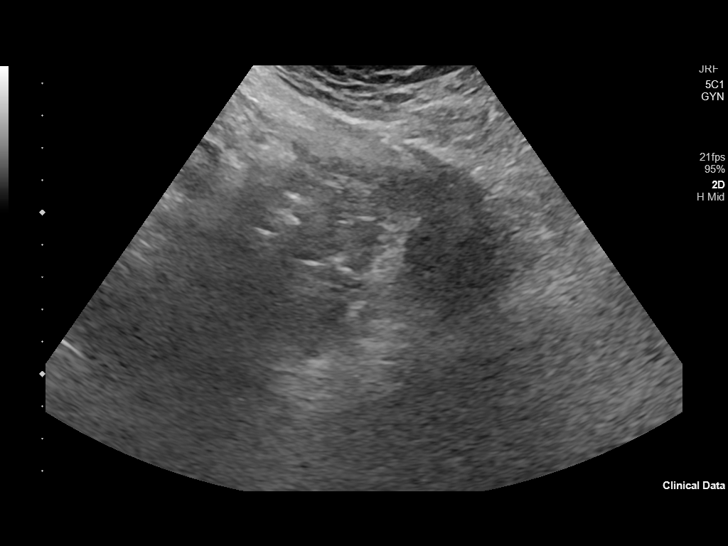
[im 34/102]
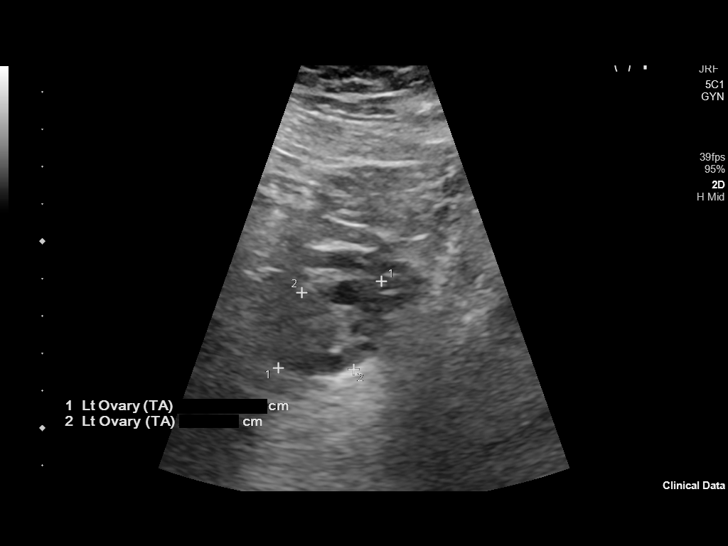
[im 43/102]
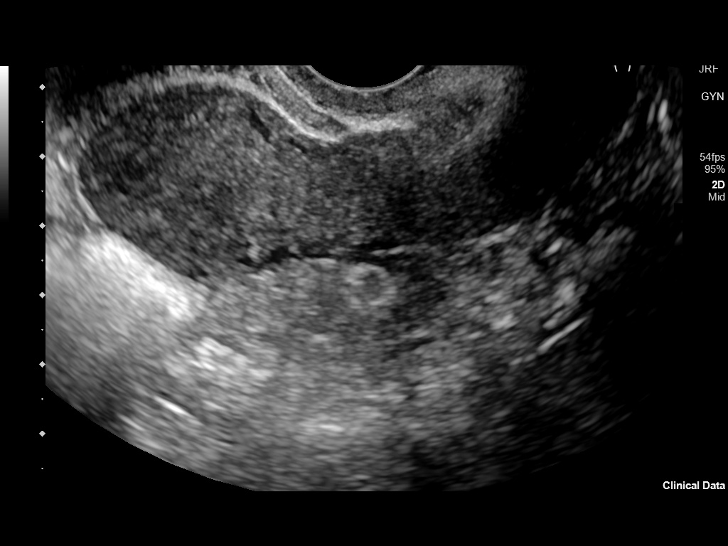
[im 51/102]
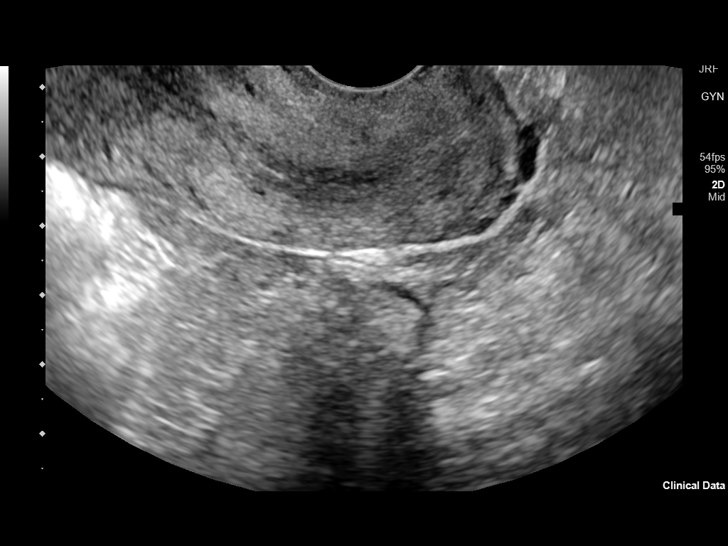
[im 59/102]
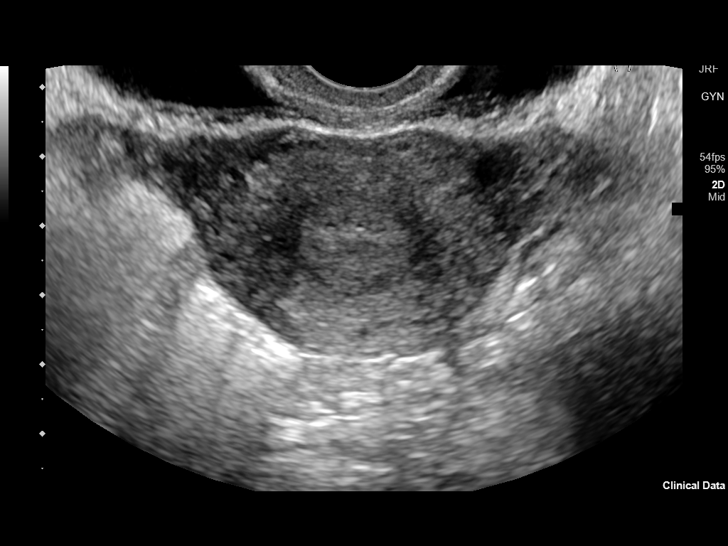
[im 68/102]
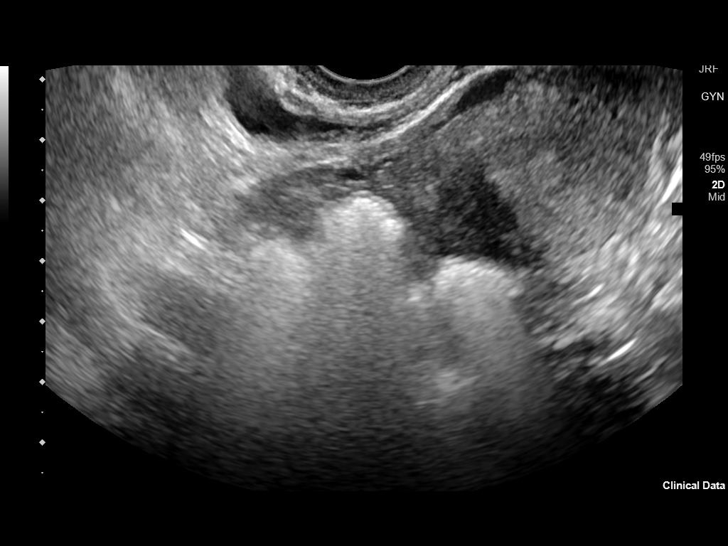
[im 76/102]
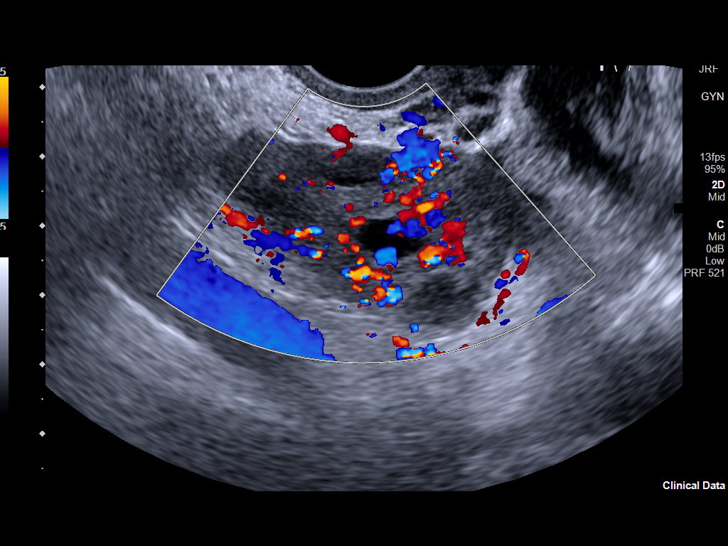
[im 85/102]
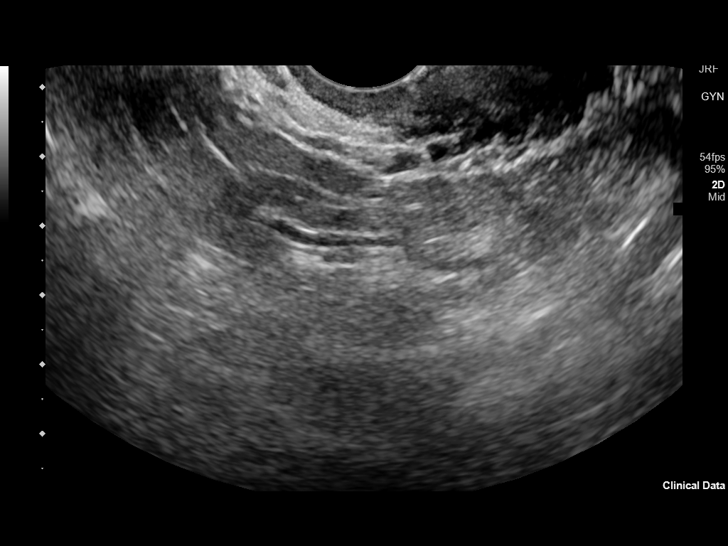
[im 93/102]
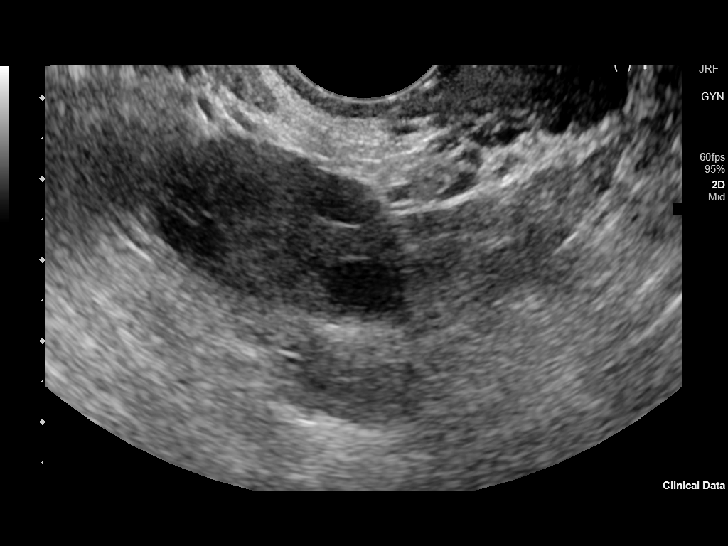
[im 102/102]
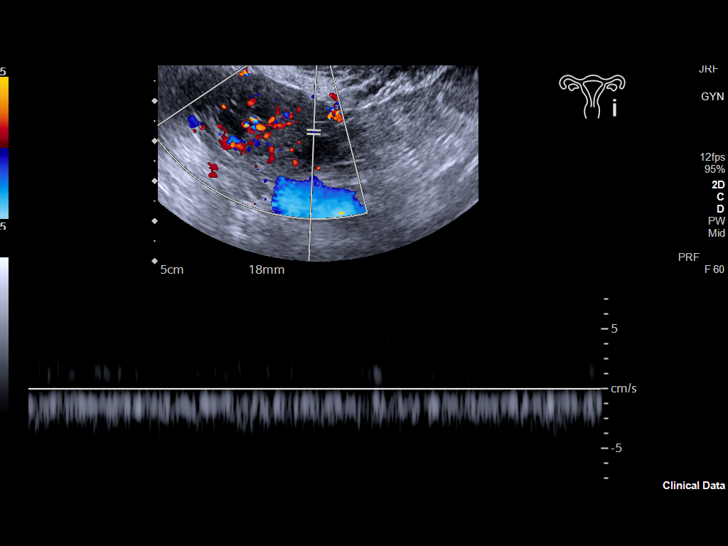

[13 of 25 positions shown; findings below may reference images not displayed]

FINDINGS: Uterus

Measurements: 8.4 x 3.4 x 4.1 cm = volume: 62 mL. No fibroids or
other mass visualized.

Endometrium

Thickness: Normal thickness, 12 mm. No focal abnormality visualized.

Right ovary

Measurements: 4.5 x 2.6 x 2.5 cm = volume: 16 mL. Normal
appearance/no adnexal mass.

Left ovary

Measurements: 3.4 x 2.3 x 2.0 cm = volume: 8 mL. Normal
appearance/no adnexal mass.

Pulsed Doppler evaluation of both ovaries demonstrates normal
low-resistance arterial and venous waveforms.

Other findings

No abnormal free fluid.
IMPRESSION: Normal pelvic ultrasound.

## 2024-03-13 DIAGNOSIS — J029 Acute pharyngitis, unspecified: Secondary | ICD-10-CM | POA: Diagnosis not present

## 2024-03-20 DIAGNOSIS — J069 Acute upper respiratory infection, unspecified: Secondary | ICD-10-CM | POA: Diagnosis not present

## 2024-04-01 DIAGNOSIS — Z6828 Body mass index (BMI) 28.0-28.9, adult: Secondary | ICD-10-CM | POA: Diagnosis not present

## 2024-04-01 DIAGNOSIS — A084 Viral intestinal infection, unspecified: Secondary | ICD-10-CM | POA: Diagnosis not present

## 2024-04-01 DIAGNOSIS — R030 Elevated blood-pressure reading, without diagnosis of hypertension: Secondary | ICD-10-CM | POA: Diagnosis not present

## 2024-04-01 DIAGNOSIS — E663 Overweight: Secondary | ICD-10-CM | POA: Diagnosis not present

## 2024-06-13 DIAGNOSIS — J029 Acute pharyngitis, unspecified: Secondary | ICD-10-CM | POA: Diagnosis not present

## 2024-06-13 DIAGNOSIS — H6693 Otitis media, unspecified, bilateral: Secondary | ICD-10-CM | POA: Diagnosis not present

## 2024-06-13 DIAGNOSIS — R070 Pain in throat: Secondary | ICD-10-CM | POA: Diagnosis not present

## 2024-06-14 ENCOUNTER — Encounter: Admitting: Obstetrics & Gynecology

## 2024-07-05 DIAGNOSIS — R109 Unspecified abdominal pain: Secondary | ICD-10-CM | POA: Diagnosis not present

## 2024-07-05 DIAGNOSIS — R5383 Other fatigue: Secondary | ICD-10-CM | POA: Diagnosis not present

## 2024-08-09 ENCOUNTER — Encounter: Payer: Self-pay | Admitting: Certified Nurse Midwife

## 2024-08-09 ENCOUNTER — Ambulatory Visit: Admitting: Certified Nurse Midwife

## 2024-08-09 VITALS — BP 137/89 | HR 84 | Ht 63.0 in | Wt 170.3 lb

## 2024-08-09 DIAGNOSIS — N939 Abnormal uterine and vaginal bleeding, unspecified: Secondary | ICD-10-CM

## 2024-08-09 DIAGNOSIS — D699 Hemorrhagic condition, unspecified: Secondary | ICD-10-CM | POA: Insufficient documentation

## 2024-08-09 MED ORDER — NORETHIN ACE-ETH ESTRAD-FE 1-20 MG-MCG PO TABS: 1.0000 | ORAL_TABLET | Freq: Every day | ORAL | 0 refills | Status: DC
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
## 2024-08-10 ENCOUNTER — Other Ambulatory Visit: Payer: Self-pay

## 2024-08-10 DIAGNOSIS — D699 Hemorrhagic condition, unspecified: Secondary | ICD-10-CM

## 2024-08-10 MED ORDER — NORETHIN ACE-ETH ESTRAD-FE 1-20 MG-MCG PO TABS: 1.0000 | ORAL_TABLET | Freq: Every day | ORAL | 0 refills | Status: AC

## 2024-10-31 DIAGNOSIS — I10 Essential (primary) hypertension: Secondary | ICD-10-CM | POA: Diagnosis not present

## 2024-10-31 DIAGNOSIS — J069 Acute upper respiratory infection, unspecified: Secondary | ICD-10-CM | POA: Diagnosis not present

## 2024-10-31 DIAGNOSIS — E669 Obesity, unspecified: Secondary | ICD-10-CM | POA: Diagnosis not present

## 2024-10-31 DIAGNOSIS — Z6830 Body mass index (BMI) 30.0-30.9, adult: Secondary | ICD-10-CM | POA: Diagnosis not present
